# Patient Record
Sex: Male | Born: 1985 | Hispanic: No | Marital: Single | Smoking: Former smoker
Health system: Southern US, Community
[De-identification: ages and names within clinical notes are randomized; demographics above are authoritative.]

## PROBLEM LIST (undated history)

## (undated) DIAGNOSIS — F32A Depression, unspecified: Secondary | ICD-10-CM

## (undated) DIAGNOSIS — Z91018 Allergy to other foods: Secondary | ICD-10-CM

## (undated) DIAGNOSIS — F329 Major depressive disorder, single episode, unspecified: Secondary | ICD-10-CM

## (undated) DIAGNOSIS — A419 Sepsis, unspecified organism: Secondary | ICD-10-CM

## (undated) DIAGNOSIS — K760 Fatty (change of) liver, not elsewhere classified: Secondary | ICD-10-CM

## (undated) DIAGNOSIS — F419 Anxiety disorder, unspecified: Secondary | ICD-10-CM

## (undated) HISTORY — DX: Major depressive disorder, single episode, unspecified: F32.9

## (undated) HISTORY — DX: Depression, unspecified: F32.A

## (undated) HISTORY — DX: Fatty (change of) liver, not elsewhere classified: K76.0

## (undated) HISTORY — DX: Sepsis, unspecified organism: A41.9

---

## 2014-08-22 ENCOUNTER — Emergency Department (HOSPITAL_COMMUNITY)
Admission: EM | Admit: 2014-08-22 | Discharge: 2014-08-22 | Disposition: A | Discharge status: Home / Self Care | Payer: Self-pay | Attending: Emergency Medicine | Admitting: Emergency Medicine

## 2014-08-22 ENCOUNTER — Encounter (HOSPITAL_COMMUNITY): Payer: Self-pay | Admitting: Family Medicine

## 2014-08-22 ENCOUNTER — Emergency Department (HOSPITAL_COMMUNITY): Payer: Self-pay

## 2014-08-22 DIAGNOSIS — R05 Cough: Secondary | ICD-10-CM

## 2014-08-22 DIAGNOSIS — R059 Cough, unspecified: Secondary | ICD-10-CM

## 2014-08-22 DIAGNOSIS — Z720 Tobacco use: Secondary | ICD-10-CM | POA: Insufficient documentation

## 2014-08-22 DIAGNOSIS — J209 Acute bronchitis, unspecified: Secondary | ICD-10-CM | POA: Insufficient documentation

## 2014-08-22 MED ORDER — AEROCHAMBER PLUS W/MASK MISC
1.0000 | Freq: Once | Status: AC
  Administered 2014-08-22: 1
  Filled 2014-08-22: qty 1

## 2014-08-22 MED ORDER — ALBUTEROL SULFATE HFA 108 (90 BASE) MCG/ACT IN AERS
2.0000 | INHALATION_SPRAY | RESPIRATORY_TRACT | Status: DC | PRN
  Administered 2014-08-22: 2 via RESPIRATORY_TRACT
  Filled 2014-08-22 (×2): qty 6.7

## 2014-08-22 NOTE — Discharge Instructions (Signed)
Acute Bronchitis Use your inhaler 2 puffs every 4 hours as needed for cough or shortness of breath. Take Tylenol as needed for discomfort. Called out [facility] tomorrow to get a primary care physician. Ask your new primary care physician to help you to stop smoking Bronchitis is inflammation of the airways that extend from the windpipe into the lungs (bronchi). The inflammation often causes mucus to develop. This leads to a cough, which is the most common symptom of bronchitis.  In acute bronchitis, the condition usually develops suddenly and goes away over time, usually in a couple weeks. Smoking, allergies, and asthma can make bronchitis worse. Repeated episodes of bronchitis may cause further lung problems.  CAUSES Acute bronchitis is most often caused by the same virus that causes a cold. The virus can spread from person to person (contagious) through coughing, sneezing, and touching contaminated objects. SIGNS AND SYMPTOMS   Cough.   Fever.   Coughing up mucus.   Body aches.   Chest congestion.   Chills.   Shortness of breath.   Sore throat.  DIAGNOSIS  Acute bronchitis is usually diagnosed through a physical exam. Your health care provider will also ask you questions about your medical history. Tests, such as chest X-rays, are sometimes done to rule out other conditions.  TREATMENT  Acute bronchitis usually goes away in a couple weeks. Oftentimes, no medical treatment is necessary. Medicines are sometimes given for relief of fever or cough. Antibiotic medicines are usually not needed but may be prescribed in certain situations. In some cases, an inhaler may be recommended to help reduce shortness of breath and control the cough. A cool mist vaporizer may also be used to help thin bronchial secretions and make it easier to clear the chest.  HOME CARE INSTRUCTIONS  Get plenty of rest.   Drink enough fluids to keep your urine clear or pale yellow  (unless you have a medical condition that requires fluid restriction). Increasing fluids may help thin your respiratory secretions (sputum) and reduce chest congestion, and it will prevent dehydration.   Take medicines only as directed by your health care provider.  If you were prescribed an antibiotic medicine, finish it all even if you start to feel better.  Avoid smoking and secondhand smoke. Exposure to cigarette smoke or irritating chemicals will make bronchitis worse. If you are a smoker, consider using nicotine gum or skin patches to help control withdrawal symptoms. Quitting smoking will help your lungs heal faster.   Reduce the chances of another bout of acute bronchitis by washing your hands frequently, avoiding people with cold symptoms, and trying not to touch your hands to your mouth, nose, or eyes.   Keep all follow-up visits as directed by your health care provider.  SEEK MEDICAL CARE IF: Your symptoms do not improve after 1 week of treatment.  SEEK IMMEDIATE MEDICAL CARE IF:  You develop an increased fever or chills.   You have chest pain.   You have severe shortness of breath.  You have bloody sputum.   You develop dehydration.  You faint or repeatedly feel like you are going to pass out.  You develop repeated vomiting.  You develop a severe headache. MAKE SURE YOU:   Understand these instructions.  Will watch your condition.  Will get help right away if you are not doing well or get worse. Document Released: 06/18/2004 Document Revised: 09/25/2013 Document Reviewed: 11/01/2012 [facility] Patient Information 2015 [place], [place]. This information is not intended  to replace advice given to you by your health care provider. Make sure you discuss any questions you have with your health care provider.

## 2014-08-22 NOTE — ED Notes (Signed)
Pt having right sided chest pain x 2 days when he takes a deep breath. sts slight cough. sts he has been spitting stuff up.

## 2014-08-22 NOTE — ED Provider Notes (Signed)
CSN: [id number]     Arrival date & time 08/22/14  1850 History   First MD Initiated Contact with Patient 08/22/14 1932     Chief Complaint  Patient presents with  . Chest Pain     (Consider location/radiation/quality/duration/timing/severity/associated sxs/prior Treatment) HPI Complains of right-sided anterior chest pain nonradiating for 2 days. Pain is worse with coughing. Other associated symptoms; He feels congested in his chest He admits to nonproductive cough. He denies shortness of breath. No other associated symptoms. No treatment prior to coming here. History reviewed. No pertinent past medical history. past medical history negative History reviewed. No pertinent past surgical history. surgical history negative History reviewed. No pertinent family history. History  Substance Use Topics  . Smoking status: Current Every Day Smoker -- 0.50 packs/day  . Smokeless tobacco: Not on file  . Alcohol Use: No    no illicit drug use  Review of Systems  Respiratory: Positive for cough.   Cardiovascular: Positive for chest pain.  All other systems reviewed and are negative.     Allergies  Sulfa antibiotics  Home Medications   Prior to Admission medications   Not on File   BP 115/86 mmHg  Pulse 72  Temp(Src) 98.2 F (36.8 C)  Resp 16  Ht 5' 10"  (1.778 m)  Wt 260 lb (117.935 kg)  BMI 37.31 kg/m2  SpO2 99% Physical Exam  Constitutional: He appears well-developed and well-nourished.  HENT:  Head: Normocephalic and atraumatic.  Eyes: Conjunctivae are normal. Pupils are equal, round, and reactive to light.  Neck: Neck supple. No tracheal deviation present. No thyromegaly present.  Cardiovascular: Normal rate and regular rhythm.   No murmur heard. Pulmonary/Chest: Effort normal and breath sounds normal.  Abdominal: Soft. Bowel sounds are normal. He exhibits no distension. There is no tenderness.  Obese  Musculoskeletal: Normal range of motion. He exhibits no edema or  tenderness.  Neurological: He is alert. Coordination normal.  Skin: Skin is warm and dry. No rash noted.  Psychiatric: He has a normal mood and affect.  Nursing note and vitals reviewed.   ED Course  Procedures (including critical care time) Labs Review Labs Reviewed - No data to display  Imaging Review Dg Chest 2 View  08/22/2014   CLINICAL DATA:  Cough. Right-sided chest pain for 2 days with deep breath.  EXAM: CHEST  2 VIEW  COMPARISON:  None.  FINDINGS: Mild bronchial thickening and borderline prominent interstitial markings. The cardiomediastinal contours are normal. The lungs are clear. Pulmonary vasculature is normal. No consolidation, pleural effusion, or pneumothorax. No acute osseous abnormalities are seen.  IMPRESSION: Mild bronchial thickening and borderline prominent interstitial markings, can be seen in the setting of bronchitis or asthma. No focal pulmonary process.   Electronically Signed   By: Jeb Levering M.D.   On: 08/22/2014 19:23     EKG Interpretation None      chest x-ray viewed by me  feels improved after treatment with albuterol HFA with spacer  No results found for this or any previous visit. Dg Chest 2 View  08/22/2014   CLINICAL DATA:  Cough. Right-sided chest pain for 2 days with deep breath.  EXAM: CHEST  2 VIEW  COMPARISON:  None.  FINDINGS: Mild bronchial thickening and borderline prominent interstitial markings. The cardiomediastinal contours are normal. The lungs are clear. Pulmonary vasculature is normal. No consolidation, pleural effusion, or pneumothorax. No acute osseous abnormalities are seen.  IMPRESSION: Mild bronchial thickening and borderline prominent interstitial markings, can be seen  in the setting of bronchitis or asthma. No focal pulmonary process.   Electronically Signed   By: Jeb Levering M.D.   On: 08/22/2014 19:23    MDM   counseled patient for 5 minutes on smoking cessation  An albuterol HFA with spacer to go to use 2 puffs  every 4 hours when necessary cough or shortness of breath  Referral [facility]  Dx #1 acute bronchitis  #2 tobacco abuse  Final diagnoses:  Cough        Orlie Dakin, MD 08/22/14 2158

## 2016-03-25 DIAGNOSIS — A419 Sepsis, unspecified organism: Secondary | ICD-10-CM

## 2016-03-25 HISTORY — DX: Sepsis, unspecified organism: A41.9

## 2016-04-15 ENCOUNTER — Emergency Department (HOSPITAL_COMMUNITY): Payer: Self-pay

## 2016-04-15 ENCOUNTER — Encounter (HOSPITAL_COMMUNITY): Payer: Self-pay | Admitting: Nurse Practitioner

## 2016-04-15 ENCOUNTER — Inpatient Hospital Stay (HOSPITAL_COMMUNITY)
Admission: EM | Admit: 2016-04-15 | Discharge: 2016-04-17 | DRG: 871 | Disposition: A | Discharge status: Home / Self Care | Payer: Self-pay | Attending: Internal Medicine | Admitting: Internal Medicine

## 2016-04-15 DIAGNOSIS — J189 Pneumonia, unspecified organism: Secondary | ICD-10-CM | POA: Diagnosis present

## 2016-04-15 DIAGNOSIS — Z77120 Contact with and (suspected) exposure to mold (toxic): Secondary | ICD-10-CM

## 2016-04-15 DIAGNOSIS — E872 Acidosis, unspecified: Secondary | ICD-10-CM

## 2016-04-15 DIAGNOSIS — J45909 Unspecified asthma, uncomplicated: Secondary | ICD-10-CM | POA: Diagnosis present

## 2016-04-15 DIAGNOSIS — E860 Dehydration: Secondary | ICD-10-CM | POA: Diagnosis present

## 2016-04-15 DIAGNOSIS — B351 Tinea unguium: Secondary | ICD-10-CM | POA: Diagnosis present

## 2016-04-15 DIAGNOSIS — E876 Hypokalemia: Secondary | ICD-10-CM | POA: Diagnosis present

## 2016-04-15 DIAGNOSIS — Z87891 Personal history of nicotine dependence: Secondary | ICD-10-CM

## 2016-04-15 DIAGNOSIS — L818 Other specified disorders of pigmentation: Secondary | ICD-10-CM | POA: Diagnosis present

## 2016-04-15 DIAGNOSIS — R062 Wheezing: Secondary | ICD-10-CM

## 2016-04-15 DIAGNOSIS — A419 Sepsis, unspecified organism: Principal | ICD-10-CM | POA: Diagnosis present

## 2016-04-15 DIAGNOSIS — F17210 Nicotine dependence, cigarettes, uncomplicated: Secondary | ICD-10-CM | POA: Diagnosis present

## 2016-04-15 DIAGNOSIS — J209 Acute bronchitis, unspecified: Secondary | ICD-10-CM | POA: Diagnosis present

## 2016-04-15 LAB — I-STAT TROPONIN, ED: Troponin i, poc: 0 ng/mL (ref 0.00–0.08)

## 2016-04-15 LAB — DIFFERENTIAL
BASOS ABS: 0 10*3/uL (ref 0.0–0.1)
Basophils Relative: 0 %
Eosinophils Absolute: 0.4 10*3/uL (ref 0.0–0.7)
Eosinophils Relative: 3 %
LYMPHS ABS: 6.3 10*3/uL — AB (ref 0.7–4.0)
Lymphocytes Relative: 44 %
MONOS PCT: 8 %
Monocytes Absolute: 1.1 10*3/uL — ABNORMAL HIGH (ref 0.1–1.0)
NEUTROS PCT: 45 %
Neutro Abs: 6.5 10*3/uL (ref 1.7–7.7)

## 2016-04-15 LAB — URINALYSIS, ROUTINE W REFLEX MICROSCOPIC
Bilirubin Urine: NEGATIVE
GLUCOSE, UA: NEGATIVE mg/dL
Hgb urine dipstick: NEGATIVE
Ketones, ur: NEGATIVE mg/dL
LEUKOCYTES UA: NEGATIVE
NITRITE: NEGATIVE
Protein, ur: NEGATIVE mg/dL
Specific Gravity, Urine: 1.011 (ref 1.005–1.030)
pH: 6 (ref 5.0–8.0)

## 2016-04-15 LAB — CBC
HEMATOCRIT: 47.6 % (ref 39.0–52.0)
Hemoglobin: 17 g/dL (ref 13.0–17.0)
MCH: 30.2 pg (ref 26.0–34.0)
MCHC: 35.7 g/dL (ref 30.0–36.0)
MCV: 84.7 fL (ref 78.0–100.0)
PLATELETS: 340 10*3/uL (ref 150–400)
RBC: 5.62 MIL/uL (ref 4.22–5.81)
RDW: 12.5 % (ref 11.5–15.5)
WBC: 14.3 10*3/uL — ABNORMAL HIGH (ref 4.0–10.5)

## 2016-04-15 LAB — BASIC METABOLIC PANEL
Anion gap: 14 (ref 5–15)
BUN: 16 mg/dL (ref 6–20)
CALCIUM: 9.3 mg/dL (ref 8.9–10.3)
CO2: 19 mmol/L — ABNORMAL LOW (ref 22–32)
CREATININE: 1.14 mg/dL (ref 0.61–1.24)
Chloride: 102 mmol/L (ref 101–111)
GFR calc Af Amer: >60 mL/min (ref >60)
GLUCOSE: 123 mg/dL — AB (ref 65–99)
POTASSIUM: 3 mmol/L — AB (ref 3.5–5.1)
SODIUM: 135 mmol/L (ref 135–145)

## 2016-04-15 LAB — INFLUENZA PANEL BY PCR (TYPE A & B)
INFLAPCR: NEGATIVE
Influenza B By PCR: NEGATIVE

## 2016-04-15 LAB — HEPATIC FUNCTION PANEL
ALT: 36 U/L (ref 17–63)
AST: 34 U/L (ref 15–41)
Albumin: 4.9 g/dL (ref 3.5–5.0)
Alkaline Phosphatase: 62 U/L (ref 38–126)
BILIRUBIN TOTAL: 0.8 mg/dL (ref 0.3–1.2)
Total Protein: 8.1 g/dL (ref 6.5–8.1)

## 2016-04-15 LAB — I-STAT CG4 LACTIC ACID, ED
LACTIC ACID, VENOUS: 1.37 mmol/L (ref 0.5–1.9)
Lactic Acid, Venous: 5.63 mmol/L (ref 0.5–1.9)

## 2016-04-15 LAB — RAPID URINE DRUG SCREEN, HOSP PERFORMED
Amphetamines: NOT DETECTED
BARBITURATES: NOT DETECTED
Benzodiazepines: NOT DETECTED
Cocaine: NOT DETECTED
Opiates: NOT DETECTED
Tetrahydrocannabinol: POSITIVE — AB

## 2016-04-15 MED ORDER — PIPERACILLIN-TAZOBACTAM 3.375 G IVPB 30 MIN
3.3750 g | Freq: Once | INTRAVENOUS | Status: AC
  Administered 2016-04-15: 3.375 g via INTRAVENOUS
  Filled 2016-04-15: qty 50

## 2016-04-15 MED ORDER — VANCOMYCIN HCL IN DEXTROSE 1-5 GM/200ML-% IV SOLN
1000.0000 mg | Freq: Once | INTRAVENOUS | Status: AC
  Administered 2016-04-15: 1000 mg via INTRAVENOUS
  Filled 2016-04-15: qty 200

## 2016-04-15 MED ORDER — SODIUM CHLORIDE 0.9% FLUSH: 3.0000 mL | Freq: Two times a day (BID) | INTRAVENOUS | Status: DC

## 2016-04-15 MED ORDER — ONDANSETRON HCL 4 MG PO TABS: 4.0000 mg | ORAL_TABLET | Freq: Four times a day (QID) | ORAL | Status: DC | PRN

## 2016-04-15 MED ORDER — PIPERACILLIN-TAZOBACTAM 3.375 G IVPB
3.3750 g | Freq: Three times a day (TID) | INTRAVENOUS | Status: DC
  Administered 2016-04-16 (×2): 3.375 g via INTRAVENOUS
  Filled 2016-04-15 (×2): qty 50

## 2016-04-15 MED ORDER — SODIUM CHLORIDE 0.9 % IV BOLUS (SEPSIS)
1000.0000 mL | Freq: Once | INTRAVENOUS | Status: AC
  Administered 2016-04-15: 1000 mL via INTRAVENOUS

## 2016-04-15 MED ORDER — ENOXAPARIN SODIUM 40 MG/0.4ML ~~LOC~~ SOLN
40.0000 mg | Freq: Every day | SUBCUTANEOUS | Status: DC
  Administered 2016-04-16: 40 mg via SUBCUTANEOUS
  Filled 2016-04-15: qty 0.4

## 2016-04-15 MED ORDER — ACETAMINOPHEN 650 MG RE SUPP: 650.0000 mg | Freq: Four times a day (QID) | RECTAL | Status: DC | PRN

## 2016-04-15 MED ORDER — IOPAMIDOL (ISOVUE-370) INJECTION 76%
100.0000 mL | Freq: Once | INTRAVENOUS | Status: AC | PRN
  Administered 2016-04-15: 100 mL via INTRAVENOUS

## 2016-04-15 MED ORDER — SODIUM CHLORIDE 0.9 % IV BOLUS (SEPSIS)
1000.0000 mL | Freq: Once | INTRAVENOUS | Status: AC
Start: 2016-04-15 — End: 2016-04-15
  Administered 2016-04-15: 1000 mL via INTRAVENOUS

## 2016-04-15 MED ORDER — ONDANSETRON HCL 4 MG/2ML IJ SOLN: 4.0000 mg | Freq: Four times a day (QID) | INTRAMUSCULAR | Status: DC | PRN

## 2016-04-15 MED ORDER — LORAZEPAM 2 MG/ML IJ SOLN
0.5000 mg | Freq: Once | INTRAMUSCULAR | Status: AC
  Administered 2016-04-15: 0.5 mg via INTRAVENOUS
  Filled 2016-04-15: qty 1

## 2016-04-15 MED ORDER — ALBUTEROL SULFATE (2.5 MG/3ML) 0.083% IN NEBU: 2.5000 mg | INHALATION_SOLUTION | RESPIRATORY_TRACT | Status: DC | PRN

## 2016-04-15 MED ORDER — POTASSIUM CHLORIDE CRYS ER 20 MEQ PO TBCR
40.0000 meq | EXTENDED_RELEASE_TABLET | Freq: Once | ORAL | Status: AC
  Administered 2016-04-15: 40 meq via ORAL
  Filled 2016-04-15: qty 2

## 2016-04-15 MED ORDER — SODIUM CHLORIDE 0.9 % IJ SOLN
INTRAMUSCULAR | Status: AC
  Filled 2016-04-15: qty 50

## 2016-04-15 MED ORDER — VANCOMYCIN HCL IN DEXTROSE 1-5 GM/200ML-% IV SOLN
1000.0000 mg | Freq: Three times a day (TID) | INTRAVENOUS | Status: DC
  Administered 2016-04-16 (×2): 1000 mg via INTRAVENOUS
  Filled 2016-04-15 (×2): qty 200

## 2016-04-15 MED ORDER — POTASSIUM CHLORIDE IN NACL 20-0.9 MEQ/L-% IV SOLN
INTRAVENOUS | Status: DC
  Administered 2016-04-16: via INTRAVENOUS
  Filled 2016-04-15 (×2): qty 1000

## 2016-04-15 MED ORDER — ACETAMINOPHEN 325 MG PO TABS: 650.0000 mg | ORAL_TABLET | Freq: Four times a day (QID) | ORAL | Status: DC | PRN

## 2016-04-15 NOTE — Progress Notes (Signed)
EDCM spoke to patient at bedside. Patient confirms he does not have a pcp or insurance living in [place].  [facility] provided patient with contact information to [facility], informed patient of services there.  EDCM also provided patient with list of pcps who accept self pay patients, list of discount pharmacies and websites needymeds.org and GoodRX.com for medication assistance, phone number to inquire about the orange card, phone number to inquire about Medicaid, phone number to inquire about the [place], financial resources in the community such as local churches, salvation [employer], urban ministries, and dental assistance for uninsured patients.  Patient thankful for resources.  No further EDCM needs at this time.

## 2016-04-15 NOTE — H&P (Signed)
History and Physical  Patient Name: Jared Hunt     CWC:[id number]    DOB: 24-May-1986    DOA: 04/15/2016 PCP: No primary care provider on file.   Patient coming from: Home  Chief Complaint: Shortness of breath, dizziness  HPI: Jared Hunt is a 30 y.o. male with no significant past medical history who presents with several months progressive intermittent shortness of breath and cough.  The patient relates that he was in his usual state of health until a few months ago when he moved into a new apartment.  Since moving in, he started to have mild but progressive shortness of breath and cough and sinus congestion.  Then about two months ago he started staying mostly at his girlfriend's house, and noticed when he went back that (1) his symptoms of shortness of breath, chest and sinus congestion and cough always returned when he went to his apartment, and (2) things in his apartment like the carpet, walls, pillows, couch, and bed appeared to be getting covered with black mold.  He tried eating healthy and exercising, but that didn't help, and his symptoms became more and more obvious until today he was at work cutting strawberries when he felt weak, dizzy, and so short of breath he was going to pass out, so he came to the ER.  ED course: -Temp 99.75F, heart rate 156, respirations 20s, blood pressure 103/55, pulse oximetry normal -Na 135, K 3.0, Cr 1.14, WBC 14.3K, Hgb 17 -Troponin negative -Lactic acid 5.6 -Urinalysis clear -UDS showed only THC -Chest x-ray with mild interstitial thickening -CT angiogram of the chest showed no PE, only bronchial thickening consistent with bronchitis -ECG showed sinus tachycardia -He was given vancomycin and Zosyn and 30 cc/kg of fluids as well as potassium and TRH were asked to evaluate for presumed sepsis, unknown source -Repeat lactic acid completely resolved with fluids and the patient appeared comfortable in the ER and mentating well    The  patient denies IV drug use, has snorted drugs a few times. He denies having sex with men. He was last tested for HIV a few months ago and was negative.  He denies recent fever, chills, purulent sputum. He denies dysuria, hematuria, urinary frequency, urinary urgency. He denies skin sores, abdominal pain, chest pain.  He has had some episodes of bronchitis in the past, but has never been diagnosed with asthma, has no family history of asthma or allergic disease. He is not using an inhaler, has no ongoing primary care. He has no real allergic disease history personally.        ROS: Review of Systems  Constitutional: Negative for chills, fever, malaise/fatigue and weight loss.  Respiratory: Positive for shortness of breath.           History reviewed. No pertinent past medical history.  History reviewed. No pertinent surgical history.  Social History: Patient lives with his girlfriend.  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  The patient walks unasssited.  He smokes.    Allergies  Allergen Reactions  . Sulfa Antibiotics Anaphylaxis    Family history: family history includes Osteoarthritis in his mother.  Prior to Admission medications   Medication Sig Start Date End Date Taking? Authorizing Provider  Chlorella POWD Take 15 mg by mouth daily. 1 Tablespoonful.   Yes Historical Provider, MD  Creatine Monohydrate POWD Take by mouth See admin instructions. Take 1 gram daily.   Yes Historical Provider, MD  Garlic 7616 MG  CAPS Take 2,000 mg by mouth daily.   Yes Historical Provider, MD  guaiFENesin (MUCINEX) 600 MG 12 hr tablet Take 600 mg by mouth 2 (two) times daily as needed for cough or to loosen phlegm.   Yes Historical Provider, MD  L-Methionine POWD Take by mouth daily. As directed.   Yes Historical Provider, MD  MAGNESIUM PO Take 1 tablet by mouth daily as needed (magnesium supplementation.).   Yes Historical Provider, MD  Misc Natural Products (TURMERIC CURCUMIN)  CAPS Take 1 capsule by mouth daily. 1000 mg.   Yes Historical Provider, MD  MISC NATURAL PRODUCTS PO Take 1 capsule by mouth daily. "SerraGold."  Contains per product label: 18 mg calcium citrate , magnesium citrate 0.1 mg, 80,000 HUT protease, and 100,000 SPU serrapeptase.   Yes Historical Provider, MD  UNABLE TO FIND as directed. Med Name: "Asthma Away/"   Yes Historical Provider, MD       Physical Exam: BP (!) 127/51 (BP Location: Left Arm)   Pulse 105   Temp 99.4 F (37.4 C) (Oral)   Resp 14   Ht 5' 10"  (1.778 m)   Wt 108.9 kg (240 lb)   SpO2 100%   BMI 34.44 kg/m  General appearance: Well-developed, adult male, alert and in no acute distress, slightly anxious.   Eyes: Anicteric, conjunctiva pink, lids and lashes normal. PERRL.    ENT: No nasal deformity, discharge, epistaxis.  Hearing normal. OP moist without lesions.   Neck: No neck masses.  Trachea midline.  No thyromegaly/tenderness. Lymph: No cervical or supraclavicular lymphadenopathy. Skin: Warm and dry.  No jaundice.  No suspicious rashes or lesions. Cardiac: RRR, nl S1-S2, no murmurs appreciated.  Capillary refill is brisk.  JVP normal.  No LE edema.  Radial and DP pulses 2+ and symmetric. Respiratory: Normal respiratory rate and rhythm.  CTAB without rales.  Few scattered inspiratory wheezes. Abdomen: Abdomen soft.  No TTP. No ascites, distension, hepatosplenomegaly.   MSK: No deformities or effusions.  No cyanosis or clubbing. Neuro: Cranial nerves normal.  Sensation intact to light touch. Speech is fluent.  Muscle strength normal.    Psych: Sensorium intact and responding to questions, attention normal.  Behavior appropriate.  Affect normal.  Judgment and insight appear normal.     Labs on Admission:  I have personally reviewed following labs and imaging studies: CBC:  Recent Labs Lab 04/15/16 1625 04/16/16 0336  WBC 14.3* 12.4*  NEUTROABS 6.5  --   HGB 17.0 15.2  HCT 47.6 42.3  MCV 84.7 84.3  PLT 340  546   Basic Metabolic Panel:  Recent Labs Lab 04/15/16 1625 04/16/16 0336  NA 135 138  K 3.0* 4.2  CL 102 110  CO2 19* 20*  GLUCOSE 123* 88  BUN 16 12  CREATININE 1.14 0.96  CALCIUM 9.3 8.6*   GFR: Estimated Creatinine Clearance: 139.1 mL/min (by C-G formula based on SCr of 0.96 mg/dL).  Liver Function Tests:  Recent Labs Lab 04/15/16 1625  AST 34  ALT 36  ALKPHOS 62  BILITOT 0.8  PROT 8.1  ALBUMIN 4.9   No results for input(s): LIPASE, AMYLASE in the last 168 hours. No results for input(s): AMMONIA in the last 168 hours. Coagulation Profile: No results for input(s): INR, PROTIME in the last 168 hours. Cardiac Enzymes: No results for input(s): CKTOTAL, CKMB, CKMBINDEX, TROPONINI in the last 168 hours. BNP (last 3 results) No results for input(s): PROBNP in the last 8760 hours. HbA1C: No results for input(s): HGBA1C  in the last 72 hours. CBG: No results for input(s): GLUCAP in the last 168 hours. Lipid Profile: No results for input(s): CHOL, HDL, LDLCALC, TRIG, CHOLHDL, LDLDIRECT in the last 72 hours. Thyroid Function Tests: No results for input(s): TSH, T4TOTAL, FREET4, T3FREE, THYROIDAB in the last 72 hours. Anemia Panel: No results for input(s): VITAMINB12, FOLATE, FERRITIN, TIBC, IRON, RETICCTPCT in the last 72 hours. Sepsis Labs: Lactic acid 5.6 --> 1.3 Invalid input(s): PROCALCITONIN, LACTICIDVEN No results found for this or any previous visit (from the past 240 hour(s)).       Radiological Exams on Admission: Personally reviewed CXR shows no focal opacity.  CTA report reviewed: Dg Chest 2 View  Result Date: 04/15/2016 CLINICAL DATA:  30 year old male with a history of shortness of breath and tachypnea EXAM: CHEST  2 VIEW COMPARISON:  08/22/2014 FINDINGS: Cardiomediastinal silhouette unchanged in size and contour. No evidence of central vascular congestion. No pneumothorax or pleural effusion. No displaced fracture. IMPRESSION: No radiographic  evidence of acute cardiopulmonary disease. Signed, Dulcy Fanny. Earleen Newport, DO [facility] Radiology Electronically Signed   By: Corrie Mckusick D.O.   On: 04/15/2016 16:55   Ct Angio Chest Pe W And/or Wo Contrast  Result Date: 04/15/2016 CLINICAL DATA:  Shortness of breath and tachycardia. EXAM: CT ANGIOGRAPHY CHEST WITH CONTRAST TECHNIQUE: Multidetector CT imaging of the chest was performed using the standard protocol during bolus administration of intravenous contrast. Multiplanar CT image reconstructions and MIPs were obtained to evaluate the vascular anatomy. CONTRAST:  100 cc of Isovue 370 COMPARISON:  Chest radiograph April 15, 2016 at 1648 hours FINDINGS: CARDIOVASCULAR: Suboptimal contrast opacification of the pulmonary artery's (165 Hounsfield units, target is 250 Hounsfield units. Main pulmonary artery is not enlarged. No pulmonary arterial filling defects to the level of the proximal segmental branches. Heart size is normal, no right heart strain. No pericardial effusions. Thoracic aorta is normal course and caliber, unremarkable. MEDIASTINUM/NODES: No lymphadenopathy by CT size criteria. LUNGS/PLEURA: Mild bronchial wall thickening with mucoid impaction in the lower lobes. No pneumothorax. Small pleural effusion or focal consolidation. 3 mm RIGHT lower lobe pulmonary nodule, no indicated follow-up due to patient's young age. UPPER ABDOMEN: Included view of the abdomen is unremarkable. MUSCULOSKELETAL: Visualized soft tissues and included osseous structures appear normal. Review of the MIP images confirms the above findings. IMPRESSION: No acute pulmonary embolism on this technically limited examination. Bronchial wall thickening with lower lobe bronchi mucoid impaction associated can be seen with bronchitis or reactive airway disease. Electronically Signed   By: Elon Alas M.D.   On: 04/15/2016 18:48    EKG: Independently reviewed. Rate 148, QTc  normal, sinus tachycardia.    Assessment/Plan Principal Problem:   Sepsis ([place]) Active Problems:   Hypokalemia  1. Possible sepsis:  Suspected source unclear, atypical for pneumonia sepsis. Organism unknown. Patient meets criteria given tachycardia, tachypnea, leukocytosis, and evidence of organ dysfunction.  Alternative explanations for lactic acidosis include alcohol use and dehydration and asthma/non-infectious respiratory disease.  He has no evidence of liver failure.  -Sepsis bundle utilized:  -Blood and urine cultures drawn  -30 ml/kg bolus given in ED, will repeat lactic acid  -Start targeted antibiotics with vancomycin, Zosyn and azithromycin, to cover undifferentiated bacteremia (given no focal pneumonia on CT) as well as atypical pneumonia  -Repeat renal function and complete blood count in AM  -Code SEPSIS called to E-link       2. Dyspnea:  Other differential for the patient's dyspnea include asthma,  hypersensitivity pneumonitis.  Fungal pneumonia seems unlikely in immunocompetent patient. -Check CBC with diff/eosinophils and procalcitonin -Check IgE level -Albuterol PRN -Start steroids after diff returns -Check HIV  3. Hypokalemia:  -Check magnesium -Supplemented K in ER -Repeat BMP         DVT prophylaxis: Lovenox  Code Status: FULL  Family Communication: Mother and girlfriend at bedside  Disposition Plan: Anticipate continue empiric antibiotics and follow culture data.  Check procalcitonin, IgE and diff.  Check HIV.  De-escalate antibiotics as able. Consults called: None Admission status: Observation, med surg At the point of initial evaluation, it is my clinical opinion that admission for OBSERVATION is reasonable and necessary because the patient's presenting complaints in the context of their chronic conditions represent sufficient risk of deterioration or significant morbidity to constitute reasonable grounds for close observation in the hospital  setting, but that the patient may be medically stable for discharge from the hospital within 24 to 48 hours.    Medical decision making: Patient seen at 9:00 PM on 04/15/2016.  What exists of the patient's chart was reviewed in depth and summarized above.  Clinical condition: stable.        Edwin Dada [facility] Pager [phone]

## 2016-04-15 NOTE — ED Provider Notes (Addendum)
[facility] DEPT Provider Note   CSN: [id number] Arrival date & time: 04/15/16  1615     History   Chief Complaint Chief Complaint  Patient presents with  . Shortness of Breath  . Tachycardia    HPI Jared Hunt is a 30 y.o. male.  HPI Patient presents with 2 weeks of cough productive of yellow and brown sputum. He's had increasing shortness of breath and subjective fevers and chills. States he was at work today and became lightheaded feelings you pass out. This prompted his visit to the emergency department. States he's been taking multiple remedies without relief of his symptoms. Is concerned there is mold in his apartment causing his symptoms. Denies any chest pain, lower extremity swelling. No prolonged travel or immobility. No history of PE/DVT. History reviewed. No pertinent past medical history.  Patient Active Problem List   Diagnosis Date Noted  . Sepsis ([place]) 04/15/2016    History reviewed. No pertinent surgical history.     Home Medications    Prior to Admission medications   Medication Sig Start Date End Date Taking? Authorizing Provider  Chlorella POWD Take 15 mg by mouth daily. 1 Tablespoonful.   Yes Historical Provider, MD  Creatine Monohydrate POWD Take by mouth See admin instructions. Take 1 gram daily.   Yes Historical Provider, MD  Garlic 6754 MG CAPS Take 2,000 mg by mouth daily.   Yes Historical Provider, MD  guaiFENesin (MUCINEX) 600 MG 12 hr tablet Take 600 mg by mouth 2 (two) times daily as needed for cough or to loosen phlegm.   Yes Historical Provider, MD  L-Methionine POWD Take by mouth daily. As directed.   Yes Historical Provider, MD  MAGNESIUM PO Take 1 tablet by mouth daily as needed (magnesium supplementation.).   Yes Historical Provider, MD  Misc Natural Products (TURMERIC CURCUMIN) CAPS Take 1 capsule by mouth daily. 1000 mg.   Yes Historical Provider, MD  MISC NATURAL PRODUCTS PO Take 1 capsule by mouth daily. "SerraGold."   Contains per product label: 18 mg calcium citrate , magnesium citrate 0.1 mg, 80,000 HUT protease, and 100,000 SPU serrapeptase.   Yes Historical Provider, MD  UNABLE TO FIND as directed. Med Name: "Asthma Away/"   Yes Historical Provider, MD    Family History History reviewed. No pertinent family history.  Social History Social History  Substance Use Topics  . Smoking status: Current Every Day Smoker    Packs/day: 0.50  . Smokeless tobacco: Never Used  . Alcohol use No     Allergies   Sulfa antibiotics   Review of Systems Review of Systems  Constitutional: Positive for chills, fatigue and fever.  HENT: Positive for congestion, sinus pressure and sore throat.   Eyes: Negative for photophobia and visual disturbance.  Respiratory: Positive for cough and shortness of breath. Negative for chest tightness.   Cardiovascular: Positive for palpitations. Negative for chest pain and leg swelling.  Gastrointestinal: Negative for abdominal pain, diarrhea, nausea and vomiting.  Genitourinary: Negative for dysuria, flank pain, frequency and hematuria.  Musculoskeletal: Negative for back pain, myalgias, neck pain and neck stiffness.  Skin: Negative for rash and wound.  Neurological: Positive for dizziness and light-headedness. Negative for syncope, weakness, numbness and headaches.  Psychiatric/Behavioral: The patient is nervous/anxious.   All other systems reviewed and are negative.    Physical Exam Updated Vital Signs BP 130/74 (BP Location: Right Arm)   Pulse 96   Temp 97.7 F (36.5 C)   Resp 14   Ht  5' 10"  (1.778 m)   Wt 240 lb (108.9 kg)   SpO2 100%   BMI 34.44 kg/m   Physical Exam  Constitutional: He is oriented to person, place, and time. He appears well-developed and well-nourished.  Very anxious appearing  HENT:  Head: Normocephalic and atraumatic.  Mouth/Throat: Oropharynx is clear and moist. No oropharyngeal exudate.  Bilateral nasal mucosal edema. Oropharynx is  mildly erythematous. Uvula is midline.  Eyes: EOM are normal. Pupils are equal, round, and reactive to light. Right eye exhibits discharge. Left eye exhibits no discharge.  Neck: Normal range of motion. Neck supple.  No meningismus  Cardiovascular: Regular rhythm.  Exam reveals no gallop and no friction rub.   No murmur heard. Tachycardia  Pulmonary/Chest: No stridor. He has rales.  Tachypnea. Scattered Rales  Abdominal: Soft. Bowel sounds are normal. There is no tenderness. There is no rebound and no guarding.  Musculoskeletal: Normal range of motion. He exhibits no edema or tenderness.  No lower extremity swelling, asymmetry or tenderness. Distal pulses intact.  Lymphadenopathy:    He has no cervical adenopathy.  Neurological: He is alert and oriented to person, place, and time.  Patient is alert and oriented x3 with clear, goal oriented speech. Patient has 5/5 motor in all extremities. Sensation is intact to light touch.    Skin: Skin is warm and dry. Capillary refill takes less than 2 seconds. No rash noted. No erythema.  Psychiatric: He has a normal mood and affect. His behavior is normal.  Nursing note and vitals reviewed.    ED Treatments / Results  Labs (all labs ordered are listed, but only abnormal results are displayed) Labs Reviewed  BASIC METABOLIC PANEL - Abnormal; Notable for the following:       Result Value   Potassium 3.0 (*)    CO2 19 (*)    Glucose, Bld 123 (*)    All other components within normal limits  CBC - Abnormal; Notable for the following:    WBC 14.3 (*)    All other components within normal limits  HEPATIC FUNCTION PANEL - Abnormal; Notable for the following:    Bilirubin, Direct <0.1 (*)    All other components within normal limits  RAPID URINE DRUG SCREEN, HOSP PERFORMED - Abnormal; Notable for the following:    Tetrahydrocannabinol POSITIVE (*)    All other components within normal limits  I-STAT CG4 LACTIC ACID, ED - Abnormal; Notable for  the following:    Lactic Acid, Venous 5.63 (*)    All other components within normal limits  CULTURE, BLOOD (ROUTINE X 2)  CULTURE, BLOOD (ROUTINE X 2)  URINE CULTURE  URINALYSIS, ROUTINE W REFLEX MICROSCOPIC (NOT AT [facility])  I-STAT TROPOININ, ED    EKG  EKG Interpretation  Date/Time:  Wednesday April 15 2016 16:25:20 EST Ventricular Rate:  158 PR Interval:    QRS Duration: 89 QT Interval:  273 QTC Calculation: 443 R Axis:   95 Text Interpretation:  Sinus tachycardia Borderline right axis deviation Borderline T abnormalities, inferior leads Confirmed by Lita Mains  MD, Sejla Marzano ([id number]) on 04/15/2016 4:48:02 PM       Radiology Dg Chest 2 View  Result Date: 04/15/2016 CLINICAL DATA:  30 year old male with a history of shortness of breath and tachypnea EXAM: CHEST  2 VIEW COMPARISON:  08/22/2014 FINDINGS: Cardiomediastinal silhouette unchanged in size and contour. No evidence of central vascular congestion. No pneumothorax or pleural effusion. No displaced fracture. IMPRESSION: No radiographic evidence of acute cardiopulmonary disease. Signed, York Cerise  Pasty Arch, DO [facility] Electronically Signed   By: Corrie Mckusick D.O.   On: 04/15/2016 16:55   Ct Angio Chest Pe W And/or Wo Contrast  Result Date: 04/15/2016 CLINICAL DATA:  Shortness of breath and tachycardia. EXAM: CT ANGIOGRAPHY CHEST WITH CONTRAST TECHNIQUE: Multidetector CT imaging of the chest was performed using the standard protocol during bolus administration of intravenous contrast. Multiplanar CT image reconstructions and MIPs were obtained to evaluate the vascular anatomy. CONTRAST:  100 cc of Isovue 370 COMPARISON:  Chest radiograph April 15, 2016 at 1648 hours FINDINGS: CARDIOVASCULAR: Suboptimal contrast opacification of the pulmonary artery's (165 Hounsfield units, target is 250 Hounsfield units. Main pulmonary artery is not enlarged. No pulmonary arterial  filling defects to the level of the proximal segmental branches. Heart size is normal, no right heart strain. No pericardial effusions. Thoracic aorta is normal course and caliber, unremarkable. MEDIASTINUM/NODES: No lymphadenopathy by CT size criteria. LUNGS/PLEURA: Mild bronchial wall thickening with mucoid impaction in the lower lobes. No pneumothorax. Small pleural effusion or focal consolidation. 3 mm RIGHT lower lobe pulmonary nodule, no indicated follow-up due to patient's young age. UPPER ABDOMEN: Included view of the abdomen is unremarkable. MUSCULOSKELETAL: Visualized soft tissues and included osseous structures appear normal. Review of the MIP images confirms the above findings. IMPRESSION: No acute pulmonary embolism on this technically limited examination. Bronchial wall thickening with lower lobe bronchi mucoid impaction associated can be seen with bronchitis or reactive airway disease. Electronically Signed   By: Elon Alas M.D.   On: 04/15/2016 18:48    Procedures  Procedures (including critical care time)  Medications Ordered in ED Medications  sodium chloride 0.9 % bolus 1,000 mL (0 mLs Intravenous Stopped 04/15/16 1850)    And  sodium chloride 0.9 % bolus 1,000 mL (0 mLs Intravenous Stopped 04/15/16 1825)    And  sodium chloride 0.9 % bolus 1,000 mL (1,000 mLs Intravenous New Bag/Given 04/15/16 1825)  sodium chloride 0.9 % injection (  Canceled Entry 04/15/16 1730)  sodium chloride 0.9 % bolus 1,000 mL (0 mLs Intravenous Stopped 04/15/16 1723)  LORazepam (ATIVAN) injection 0.5 mg (0.5 mg Intravenous Given 04/15/16 1644)  piperacillin-tazobactam (ZOSYN) IVPB 3.375 g (0 g Intravenous Stopped 04/15/16 1746)  vancomycin (VANCOCIN) IVPB 1000 mg/200 mL premix (1,000 mg Intravenous New Bag/Given 04/15/16 1747)  potassium chloride SA (K-DUR,KLOR-CON) CR tablet 40 mEq (40 mEq Oral Given 04/15/16 1743)  iopamidol (ISOVUE-370) 76 % injection 100 mL (100 mLs Intravenous Contrast  Given 04/15/16 1808)     Initial Impression / Assessment and Plan / ED Course  I have reviewed the triage vital signs and the nursing notes.  Pertinent labs & imaging results that were available during my care of the patient were reviewed by me and considered in my medical decision making (see chart for details).  Clinical Course   Chest x-ray without definite evidence of pneumonia. Given tachycardia and shortness of breath. Get CT angiogram rule out PE. Patient does have elevation of lactic acid. Started on sepsis protocol.   Tachycardia has resolved. Vital signs remained stable. Discussed with [facility] will admit to [facility] bed.  Final Clinical Impressions(s) / ED Diagnoses   Final diagnoses:  Sepsis, due to unspecified organism [facility])    New Prescriptions New Prescriptions   No medications on file  CRITICAL CARE Performed by: Lita Mains, Latrica Clowers Total critical care time: 30 minutes Critical care time was exclusive of separately billable procedures and treating other patients. Critical care  was necessary to treat or prevent imminent or life-threatening deterioration. Critical care was time spent personally by me on the following activities: development of treatment plan with patient and/or surrogate as well as nursing, discussions with consultants, evaluation of patient's response to treatment, examination of patient, obtaining history from patient or surrogate, ordering and performing treatments and interventions, ordering and review of laboratory studies, ordering and review of radiographic studies, pulse oximetry and re-evaluation of patient's condition.   Julianne Rice, MD 04/15/16 Yevette Edwards    Julianne Rice, MD 04/15/16 [id number]

## 2016-04-15 NOTE — ED Notes (Signed)
Paged admitting doctor regarding bed placement request.

## 2016-04-15 NOTE — ED Notes (Signed)
Patient transported to CT 

## 2016-04-15 NOTE — ED Triage Notes (Signed)
Pt presents urgently with shortness of breath, tachypnea and tachycardia, reports a sudden onset that he suspects was triggered by "mold exposure." Denies any cardiopulmonary hx.

## 2016-04-15 NOTE — Progress Notes (Signed)
Pharmacy Antibiotic Note  MASAI KIDD is a 30 y.o. male admitted on 04/15/2016 with sepsis.  Pharmacy has been consulted for vancomycin and zosyn dosing.  Plan: Vancomycin 2gm load then 1gm IV q8h Zosyn 3.375g IV Q8H infused over 4hrs. Follow renal function, cultures, clinical course  Height: 5' 10"  (177.8 cm) Weight: 240 lb (108.9 kg) IBW/kg (Calculated) : 73  Temp (24hrs), Avg:97.7 F (36.5 C), Min:97.7 F (36.5 C), Max:97.7 F (36.5 C)   Recent Labs Lab 04/15/16 1625 04/15/16 1642  WBC 14.3*  --   CREATININE 1.14  --   LATICACIDVEN  --  5.63*    Estimated Creatinine Clearance: 118.2 mL/min (by C-G formula based on SCr of 1.14 mg/dL).    Allergies  Allergen Reactions  . Sulfa Antibiotics Anaphylaxis    Antimicrobials this admission: 11/22 vanc >> 11/22 zosyn >>  Microbiology results: 11/22 BCx: sent 11/22 UCx:    Thank you for allowing pharmacy to be a part of this patient's care.  Dolly Rias RPh 04/15/2016, 6:56 PM Pager [phone]

## 2016-04-15 NOTE — ED Notes (Signed)
Patient transported to X-ray 

## 2016-04-16 DIAGNOSIS — R000 Tachycardia, unspecified: Secondary | ICD-10-CM

## 2016-04-16 DIAGNOSIS — Z87891 Personal history of nicotine dependence: Secondary | ICD-10-CM

## 2016-04-16 DIAGNOSIS — R062 Wheezing: Secondary | ICD-10-CM

## 2016-04-16 DIAGNOSIS — L818 Other specified disorders of pigmentation: Secondary | ICD-10-CM

## 2016-04-16 DIAGNOSIS — F17210 Nicotine dependence, cigarettes, uncomplicated: Secondary | ICD-10-CM

## 2016-04-16 DIAGNOSIS — Z8489 Family history of other specified conditions: Secondary | ICD-10-CM

## 2016-04-16 DIAGNOSIS — Z882 Allergy status to sulfonamides status: Secondary | ICD-10-CM

## 2016-04-16 DIAGNOSIS — Z8261 Family history of arthritis: Secondary | ICD-10-CM

## 2016-04-16 DIAGNOSIS — J209 Acute bronchitis, unspecified: Secondary | ICD-10-CM

## 2016-04-16 DIAGNOSIS — E872 Acidosis, unspecified: Secondary | ICD-10-CM

## 2016-04-16 DIAGNOSIS — B351 Tinea unguium: Secondary | ICD-10-CM

## 2016-04-16 DIAGNOSIS — R75 Inconclusive laboratory evidence of human immunodeficiency virus [HIV]: Secondary | ICD-10-CM

## 2016-04-16 DIAGNOSIS — Z825 Family history of asthma and other chronic lower respiratory diseases: Secondary | ICD-10-CM

## 2016-04-16 DIAGNOSIS — D72829 Elevated white blood cell count, unspecified: Secondary | ICD-10-CM

## 2016-04-16 LAB — CBC
HEMATOCRIT: 42.3 % (ref 39.0–52.0)
HEMOGLOBIN: 15.2 g/dL (ref 13.0–17.0)
MCH: 30.3 pg (ref 26.0–34.0)
MCHC: 35.9 g/dL (ref 30.0–36.0)
MCV: 84.3 fL (ref 78.0–100.0)
Platelets: 247 10*3/uL (ref 150–400)
RBC: 5.02 MIL/uL (ref 4.22–5.81)
RDW: 12.6 % (ref 11.5–15.5)
WBC: 12.4 10*3/uL — ABNORMAL HIGH (ref 4.0–10.5)

## 2016-04-16 LAB — BASIC METABOLIC PANEL
Anion gap: 8 (ref 5–15)
BUN: 12 mg/dL (ref 6–20)
CHLORIDE: 110 mmol/L (ref 101–111)
CO2: 20 mmol/L — AB (ref 22–32)
CREATININE: 0.96 mg/dL (ref 0.61–1.24)
Calcium: 8.6 mg/dL — ABNORMAL LOW (ref 8.9–10.3)
GFR calc non Af Amer: >60 mL/min (ref >60)
Glucose, Bld: 88 mg/dL (ref 65–99)
POTASSIUM: 4.2 mmol/L (ref 3.5–5.1)
SODIUM: 138 mmol/L (ref 135–145)

## 2016-04-16 LAB — RAPID HIV SCREEN (HIV 1/2 AB+AG)
HIV 1/2 ANTIBODIES: REACTIVE — AB
HIV-1 P24 ANTIGEN - HIV24: NONREACTIVE

## 2016-04-16 LAB — URINE CULTURE: Culture: NO GROWTH

## 2016-04-16 LAB — MAGNESIUM: MAGNESIUM: 2.4 mg/dL (ref 1.7–2.4)

## 2016-04-16 LAB — PROCALCITONIN

## 2016-04-16 MED ORDER — AZITHROMYCIN 250 MG PO TABS
500.0000 mg | ORAL_TABLET | Freq: Every day | ORAL | Status: AC
  Administered 2016-04-16: 500 mg via ORAL
  Filled 2016-04-16: qty 2

## 2016-04-16 MED ORDER — LORAZEPAM 1 MG PO TABS
1.0000 mg | ORAL_TABLET | Freq: Once | ORAL | Status: AC
  Administered 2016-04-16: 1 mg via ORAL
  Filled 2016-04-16: qty 1

## 2016-04-16 MED ORDER — AZITHROMYCIN 250 MG PO TABS
250.0000 mg | ORAL_TABLET | Freq: Every day | ORAL | Status: DC
  Administered 2016-04-17: 250 mg via ORAL
  Filled 2016-04-16: qty 1

## 2016-04-16 MED ORDER — ENOXAPARIN SODIUM 60 MG/0.6ML ~~LOC~~ SOLN
0.5000 mg/kg | Freq: Every day | SUBCUTANEOUS | Status: DC
  Administered 2016-04-16: 55 mg via SUBCUTANEOUS
  Filled 2016-04-16: qty 0.6

## 2016-04-16 MED ORDER — POTASSIUM CHLORIDE IN NACL 20-0.9 MEQ/L-% IV SOLN
INTRAVENOUS | Status: AC
  Administered 2016-04-16: 21:00:00 via INTRAVENOUS
  Filled 2016-04-16: qty 1000

## 2016-04-16 MED ORDER — ALPRAZOLAM 1 MG PO TABS
1.0000 mg | ORAL_TABLET | Freq: Two times a day (BID) | ORAL | Status: DC | PRN
  Administered 2016-04-16 – 2016-04-17 (×3): 1 mg via ORAL
  Filled 2016-04-16 (×3): qty 1

## 2016-04-16 NOTE — Progress Notes (Signed)
Patient ID: EUGEAN ARNOTT, male   DOB: 1985-07-22, 30 y.o.   MRN: [id number]  PROGRESS NOTE    Zakaria Sedor Criger  POL:[id number] DOB: 1986-01-14 DOA: 04/15/2016  PCP: No primary care provider on file.   Brief Narrative:  30 y.o. male with no significant past medical history who presented to [facility] with progressive shortness of breath and cough over past few months prior to this admission. He reported he moved into a new apartment which seemed it had lots of mold and since he lives there he started to notice his symptoms just progressively got worse. About one day prior to this admission he started to feel very weak and dizzy which prompted him to come to ED for evaluation.  In ED, pt was hemodynamically stable. Blood work showed leukocytosis of 14.3, lactic acid 5.63 which on subsequent lab it normalized. Chest x-ray showed no acute cardiopulmonary findings. CT angiogram of the chest ruled out pulmonary embolism. He was started empirically on vancomycin and Zosyn as well as azithromycin for treatment of sepsis secondary to possible bronchitis or pneumonia.   Assessment & Plan:   Principal Problem:   Sepsis secondary to bronchitis versus pneumonia (HCC) / leukocytosis - Sepsis criteria met on the admission with tachycardia, tachypnea, leukocytosis and lactic acidosis with source of infection likely bronchitis versus pneumonia - Patient started on empiric azithromycin, vancomycin and Zosyn - Blood culture and urine culture is pending - HIV result is positive on screening test and awaiting confirmation. I communicated the results with the patient in the room, please note his girlfriend was not present in the room during the time of my discussion of the results.  Active Problems:   Hypokalemia - Likely from sepsis, subsequently normalized   DVT prophylaxis: Lovenox subQ  Code Status: full code  Family Communication: girlfriend at the bedside Disposition Plan: home in  am   Consultants:   None   Procedures:   None   Antimicrobials:   Azithromycin 04/15/2016 -->  Zosyn 04/15/2016 -->  Vanco 04/15/2016 -->   Subjective: No overnight events.   Objective: Vitals:   04/15/16 2200 04/15/16 2215 04/15/16 2334 04/16/16 0415  BP: 117/70 118/67 115/74 103/73  Pulse: 99 109 (!) 108 72  Resp: 14 19 18    Temp:   98.5 F (36.9 C) 97.9 F (36.6 C)  TempSrc:   Oral Oral  SpO2: 100% 100% 100% 100%  Weight:   107 kg (235 lb 14.3 oz)   Height:   5' 10"  (1.778 m)     Intake/Output Summary (Last 24 hours) at 04/16/16 0954 Last data filed at 04/16/16 0600  Gross per 24 hour  Intake             5180 ml  Output             1800 ml  Net             3380 ml   Filed Weights   04/15/16 1713 04/15/16 2334  Weight: 108.9 kg (240 lb) 107 kg (235 lb 14.3 oz)    Examination:  General exam: Appears calm and comfortable  Respiratory system: No wheezing, no rhonchi  Cardiovascular system: S1 & S2 heard, RRR. No JVD, murmurs, rubs, gallops or clicks. No pedal edema. Gastrointestinal system: Abdomen is nondistended, soft and nontender. No organomegaly or masses felt. Normal bowel sounds heard. Central nervous system: Alert and oriented. No focal neurological deficits. Extremities: Symmetric 5 x 5 power. Skin: No rashes, lesions or  ulcers Psychiatry: Judgement and insight appear normal. Mood & affect appropriate.   Data Reviewed: I have personally reviewed following labs and imaging studies  CBC:  Recent Labs Lab 04/15/16 1625 04/16/16 0336  WBC 14.3* 12.4*  NEUTROABS 6.5  --   HGB 17.0 15.2  HCT 47.6 42.3  MCV 84.7 84.3  PLT 340 347   Basic Metabolic Panel:  Recent Labs Lab 04/15/16 1625 04/16/16 0336 04/16/16 0723  NA 135 138  --   K 3.0* 4.2  --   CL 102 110  --   CO2 19* 20*  --   GLUCOSE 123* 88  --   BUN 16 12  --   CREATININE 1.14 0.96  --   CALCIUM 9.3 8.6*  --   MG  --   --  2.4   GFR: Estimated Creatinine Clearance:  139.1 mL/min (by C-G formula based on SCr of 0.96 mg/dL). Liver Function Tests:  Recent Labs Lab 04/15/16 1625  AST 34  ALT 36  ALKPHOS 62  BILITOT 0.8  PROT 8.1  ALBUMIN 4.9   No results for input(s): LIPASE, AMYLASE in the last 168 hours. No results for input(s): AMMONIA in the last 168 hours. Coagulation Profile: No results for input(s): INR, PROTIME in the last 168 hours. Cardiac Enzymes: No results for input(s): CKTOTAL, CKMB, CKMBINDEX, TROPONINI in the last 168 hours. BNP (last 3 results) No results for input(s): PROBNP in the last 8760 hours. HbA1C: No results for input(s): HGBA1C in the last 72 hours. CBG: No results for input(s): GLUCAP in the last 168 hours. Lipid Profile: No results for input(s): CHOL, HDL, LDLCALC, TRIG, CHOLHDL, LDLDIRECT in the last 72 hours. Thyroid Function Tests: No results for input(s): TSH, T4TOTAL, FREET4, T3FREE, THYROIDAB in the last 72 hours. Anemia Panel: No results for input(s): VITAMINB12, FOLATE, FERRITIN, TIBC, IRON, RETICCTPCT in the last 72 hours. Urine analysis:    Component Value Date/Time   COLORURINE YELLOW 04/15/2016 1712   APPEARANCEUR CLEAR 04/15/2016 1712   LABSPEC 1.011 04/15/2016 1712   PHURINE 6.0 04/15/2016 1712   GLUCOSEU NEGATIVE 04/15/2016 1712   HGBUR NEGATIVE 04/15/2016 1712   BILIRUBINUR NEGATIVE 04/15/2016 1712   KETONESUR NEGATIVE 04/15/2016 1712   PROTEINUR NEGATIVE 04/15/2016 1712   NITRITE NEGATIVE 04/15/2016 1712   LEUKOCYTESUR NEGATIVE 04/15/2016 1712   Sepsis Labs: @LABRCNTIP (procalcitonin:4,lacticidven:4)   )No results found for this or any previous visit (from the past 240 hour(s)).    Radiology Studies: Dg Chest 2 View Result Date: 04/15/2016 No radiographic evidence of acute cardiopulmonary disease. Signed, Dulcy Fanny. Earleen Newport, DO [facility] Radiology Electronically Signed   By: Corrie Mckusick D.O.   On: 04/15/2016 16:55   Ct Angio Chest  Pe W And/or Wo Contrast Result Date: 04/15/2016 No acute pulmonary embolism on this technically limited examination. Bronchial wall thickening with lower lobe bronchi mucoid impaction associated can be seen with bronchitis or reactive airway disease. Electronically Signed   By: Elon Alas M.D.   On: 04/15/2016 18:48      Scheduled Meds: . [START ON 04/17/2016] azithromycin  250 mg Oral Daily  . enoxaparin (LOVENOX) injection  40 mg Subcutaneous QHS  . piperacillin-tazobactam (ZOSYN)  IV  3.375 g Intravenous Q8H  . sodium chloride flush  3 mL Intravenous Q12H  . vancomycin  1,000 mg Intravenous Q8H   Continuous Infusions: . 0.9 % NaCl with KCl 20 mEq / L 125 mL/hr at 04/16/16 0029     LOS: 0  days    Time spent: 25 minutes  Greater than 50% of the time spent on counseling and coordinating the care.   Leisa Lenz, MD [facility] Pager [phone]  If 7PM-7AM, please contact night-coverage [email or web address] Password [facility] 04/16/2016, 9:54 AM

## 2016-04-16 NOTE — Consult Note (Addendum)
Date of Admission:  04/15/2016  Date of Consult:  04/16/2016  Reason for Consult: HIV positive test Referring Physician: "Auto consult via the [place] system" and Dr Charlies Silvers   HPI: Jared Hunt is an 30 y.o. male who had been feeling relatively well other than some progressive shortness of breath and cough and sinus congestion that commenced approximately a few months ago after he moved into a new apartment where he says there is extensive black mold exposure. He also stopped smoking recently and at times is been having wheezing and is worried that he might have some 4 to of COPD. Apparently he had some presyncopal episodes and per his girlfriend was looking peaked.  He came to the emergency department where he became quite tachycardic and had soft blood pressures.  He had a lactic acid measured on admission was 5.67. White blood cell count was 14,300 with a mix of neutrophils and lymphocytes both over 6000. He was also significantly hypokalemic. He was given volume resuscitation he had a CT angiogram of the chest done which showed no evidence of pulmonary embolism but some mucus plugging and possible findings consistent with bronchitis.  He was started on antibiotics for sepsis protocol with vancomycin and Zosyn along with azithromycin.  His rapid HIV test was positive but a full-fledged fourth generation test has not yet been done during this admission.  He tells me that he tested negative for HIV one month ago as did his girlfriend. He he has girlfriend state that they have been monogamous and had no partners outside of their relationship.  I was alerted to his HIV positive test via the [facility] system.   Note the only other positive pertinent item on review of systems as a persistent fungal infection in his left big toe he believes this is due to mold exposure though I don't think that has anything to do with this. Toes not especially tender on exam.     History  reviewed. No pertinent past medical history.  History reviewed. No pertinent surgical history.  Social History:  reports that he has been smoking.  He has been smoking about 0.50 packs per day. He has never used smokeless tobacco. He reports that he uses drugs, including Marijuana. He reports that he does not drink alcohol.   Family History  Problem Relation Age of Onset  . Osteoarthritis Mother   . Asthma Neg Hx   . Allergic Disorder Neg Hx     Allergies  Allergen Reactions  . Sulfa Antibiotics Anaphylaxis     Medications: I have reviewed patients current medications as documented in Epic Anti-infectives    Start     Dose/Rate Route Frequency Ordered Stop   04/17/16 1000  azithromycin (ZITHROMAX) tablet 250 mg     250 mg Oral Daily 04/16/16 0635 04/21/16 0959   04/16/16 0645  azithromycin (ZITHROMAX) tablet 500 mg     500 mg Oral Daily 04/16/16 0635 04/16/16 0709   04/16/16 0300  vancomycin (VANCOCIN) IVPB 1000 mg/200 mL premix  Status:  Discontinued     1,000 mg 200 mL/hr over 60 Minutes Intravenous Every 8 hours 04/15/16 1858 04/16/16 1431   04/16/16 0000  piperacillin-tazobactam (ZOSYN) IVPB 3.375 g  Status:  Discontinued     3.375 g 12.5 mL/hr over 240 Minutes Intravenous Every 8 hours 04/15/16 1858 04/16/16 1546   04/15/16 1900  vancomycin (VANCOCIN) IVPB 1000 mg/200 mL premix     1,000 mg 200 mL/hr over  60 Minutes Intravenous  Once 04/15/16 1852 04/15/16 2011   04/15/16 1700  piperacillin-tazobactam (ZOSYN) IVPB 3.375 g     3.375 g 100 mL/hr over 30 Minutes Intravenous  Once 04/15/16 1653 04/15/16 1746   04/15/16 1700  vancomycin (VANCOCIN) IVPB 1000 mg/200 mL premix     1,000 mg 200 mL/hr over 60 Minutes Intravenous  Once 04/15/16 1653 04/15/16 1856         ROS:  as in HPI otherwise remainder of 12 point Review of Systems is negative   Blood pressure 113/89, pulse 62, temperature 98.1 F (36.7 C), temperature source Oral, resp. rate 16, height _0  (1.778  m), weight 235 lb 14.3 oz (107 kg), SpO2 97 %. General: Alert and awake, oriented x3, not in any acute distress. HEENT: anicteric sclera,  EOMI, oropharynx clear and without exudate Cardiovascular: regular rate, normal r,  no murmur rubs or gallops Pulmonary: clear to auscultation bilaterally, with some end-expiratory wheezes Gastrointestinal: soft nontender, nondistended, normal bowel sounds, Musculoskeletal: no  clubbing or edema noted bilaterally Skin, soft tissue: no rashes + tatoos   Left toe of 04/16/2016:      Neuro: nonfocal, strength and sensation intact   Results for orders placed or performed during the hospital encounter of 04/15/16 (from the past 48 hour(s))  Basic metabolic panel     Status: Abnormal   Collection Time: 04/15/16  4:25 PM  Result Value Ref Range   Sodium 135 135 - 145 mmol/L   Potassium 3.0 (L) 3.5 - 5.1 mmol/L   Chloride 102 101 - 111 mmol/L   CO2 19 (L) 22 - 32 mmol/L   Glucose, Bld 123 (H) 65 - 99 mg/dL   BUN 16 6 - 20 mg/dL   Creatinine, Ser 1.14 0.61 - 1.24 mg/dL   Calcium 9.3 8.9 - 10.3 mg/dL   GFR calc non Af Amer >60 >60 mL/min   GFR calc Af Amer >60 >60 mL/min    Comment: (NOTE) The eGFR has been calculated using the CKD EPI equation. This calculation has not been validated in all clinical situations. eGFR's persistently <60 mL/min signify possible Chronic Kidney Disease.    Anion gap 14 5 - 15  CBC     Status: Abnormal   Collection Time: 04/15/16  4:25 PM  Result Value Ref Range   WBC 14.3 (H) 4.0 - 10.5 K/uL   RBC 5.62 4.22 - 5.81 MIL/uL   Hemoglobin 17.0 13.0 - 17.0 g/dL   HCT 47.6 39.0 - 52.0 %   MCV 84.7 78.0 - 100.0 fL   MCH 30.2 26.0 - 34.0 pg   MCHC 35.7 30.0 - 36.0 g/dL   RDW 12.5 11.5 - 15.5 %   Platelets 340 150 - 400 K/uL  Hepatic function panel     Status: Abnormal   Collection Time: 04/15/16  4:25 PM  Result Value Ref Range   Total Protein 8.1 6.5 - 8.1 g/dL   Albumin 4.9 3.5 - 5.0 g/dL   AST 34 15 - 41 U/L     ALT 36 17 - 63 U/L   Alkaline Phosphatase 62 38 - 126 U/L   Total Bilirubin 0.8 0.3 - 1.2 mg/dL   Bilirubin, Direct <0.1 (L) 0.1 - 0.5 mg/dL   Indirect Bilirubin NOT CALCULATED 0.3 - 0.9 mg/dL  Differential     Status: Abnormal   Collection Time: 04/15/16  4:25 PM  Result Value Ref Range   Neutrophils Relative % 45 %   Lymphocytes Relative 44 %  Monocytes Relative 8 %   Eosinophils Relative 3 %   Basophils Relative 0 %   Neutro Abs 6.5 1.7 - 7.7 K/uL   Lymphs Abs 6.3 (H) 0.7 - 4.0 K/uL   Monocytes Absolute 1.1 (H) 0.1 - 1.0 K/uL   Eosinophils Absolute 0.4 0.0 - 0.7 K/uL   Basophils Absolute 0.0 0.0 - 0.1 K/uL   Smear Review MORPHOLOGY UNREMARKABLE   I-stat troponin, ED     Status: None   Collection Time: 04/15/16  4:39 PM  Result Value Ref Range   Troponin i, poc 0.00 0.00 - 0.08 ng/mL   Comment 3            Comment: Due to the release kinetics of cTnI, a negative result within the first hours of the onset of symptoms does not rule out myocardial infarction with certainty. If myocardial infarction is still suspected, repeat the test at appropriate intervals.   I-Stat CG4 Lactic Acid, ED     Status: Abnormal   Collection Time: 04/15/16  4:42 PM  Result Value Ref Range   Lactic Acid, Venous 5.63 (HH) 0.5 - 1.9 mmol/L   Comment NOTIFIED PHYSICIAN   Culture, blood (Routine X 2) w Reflex to ID Panel     Status: None (Preliminary result)   Collection Time: 04/15/16  5:10 PM  Result Value Ref Range   Specimen Description BLOOD BLOOD LEFT FOREARM    Special Requests IN PEDIATRIC BOTTLE 2CC    Culture      NO GROWTH < 24 HOURS Performed at [facility]    Report Status PENDING   Urinalysis, Routine w reflex microscopic (not at [facility])     Status: None   Collection Time: 04/15/16  5:12 PM  Result Value Ref Range   Color, Urine YELLOW YELLOW   APPearance CLEAR CLEAR   Specific Gravity, Urine 1.011 1.005 - 1.030   pH 6.0 5.0 - 8.0   Glucose, UA NEGATIVE NEGATIVE  mg/dL   Hgb urine dipstick NEGATIVE NEGATIVE   Bilirubin Urine NEGATIVE NEGATIVE   Ketones, ur NEGATIVE NEGATIVE mg/dL   Protein, ur NEGATIVE NEGATIVE mg/dL   Nitrite NEGATIVE NEGATIVE   Leukocytes, UA NEGATIVE NEGATIVE    Comment: MICROSCOPIC NOT DONE ON URINES WITH NEGATIVE PROTEIN, BLOOD, LEUKOCYTES, NITRITE, OR GLUCOSE <1000 mg/dL.  Urine culture     Status: None   Collection Time: 04/15/16  5:12 PM  Result Value Ref Range   Specimen Description URINE, CLEAN CATCH    Special Requests NONE    Culture NO GROWTH Performed at [facility]     Report Status 04/16/2016 FINAL   Rapid urine drug screen (hospital performed)     Status: Abnormal   Collection Time: 04/15/16  5:12 PM  Result Value Ref Range   Opiates NONE DETECTED NONE DETECTED   Cocaine NONE DETECTED NONE DETECTED   Benzodiazepines NONE DETECTED NONE DETECTED   Amphetamines NONE DETECTED NONE DETECTED   Tetrahydrocannabinol POSITIVE (A) NONE DETECTED   Barbiturates NONE DETECTED NONE DETECTED    Comment:        DRUG SCREEN FOR MEDICAL PURPOSES ONLY.  IF CONFIRMATION IS NEEDED FOR ANY PURPOSE, NOTIFY LAB WITHIN 5 DAYS.        LOWEST DETECTABLE LIMITS FOR URINE DRUG SCREEN Drug Class       Cutoff (ng/mL) Amphetamine      1000 Barbiturate      200 Benzodiazepine   833 Tricyclics       825 Opiates  300 Cocaine          300 THC              50   Procalcitonin     Status: None   Collection Time: 04/15/16  5:39 PM  Result Value Ref Range   Procalcitonin <0.10 ng/mL    Comment:        Interpretation: PCT (Procalcitonin) <= 0.5 ng/mL: Systemic infection (sepsis) is not likely. Local bacterial infection is possible. (NOTE)         ICU PCT Algorithm               Non ICU PCT Algorithm    ----------------------------     ------------------------------         PCT < 0.25 ng/mL                 PCT < 0.1 ng/mL     Stopping of antibiotics            Stopping of antibiotics       strongly encouraged.                strongly encouraged.    ----------------------------     ------------------------------       PCT level decrease by               PCT < 0.25 ng/mL       >= 80% from peak PCT       OR PCT 0.25 - 0.5 ng/mL          Stopping of antibiotics                                             encouraged.     Stopping of antibiotics           encouraged.    ----------------------------     ------------------------------       PCT level decrease by              PCT >= 0.25 ng/mL       < 80% from peak PCT        AND PCT >= 0.5 ng/mL            Continuin g antibiotics                                              encouraged.       Continuing antibiotics            encouraged.    ----------------------------     ------------------------------     PCT level increase compared          PCT > 0.5 ng/mL         with peak PCT AND          PCT >= 0.5 ng/mL             Escalation of antibiotics                                          strongly encouraged.      Escalation of antibiotics  strongly encouraged.   Influenza panel by PCR (type A & B, H1N1)     Status: None   Collection Time: 04/15/16  8:21 PM  Result Value Ref Range   Influenza A By PCR NEGATIVE NEGATIVE   Influenza B By PCR NEGATIVE NEGATIVE    Comment: (NOTE) The Xpert Xpress Flu assay is intended as an aid in the diagnosis of  influenza and should not be used as a sole basis for treatment.  This  assay is FDA approved for nasopharyngeal swab specimens only. Nasal  washings and aspirates are unacceptable for Xpert Xpress Flu testing.   I-Stat CG4 Lactic Acid, ED     Status: None   Collection Time: 04/15/16  8:37 PM  Result Value Ref Range   Lactic Acid, Venous 1.37 0.5 - 1.9 mmol/L  Culture, blood (Routine X 2) w Reflex to ID Panel     Status: None (Preliminary result)   Collection Time: 04/15/16  8:53 PM  Result Value Ref Range   Specimen Description BLOOD BLOOD LEFT FOREARM    Special Requests BOTTLES DRAWN AEROBIC  AND ANAEROBIC 5CC    Culture      NO GROWTH < 24 HOURS Performed at [facility]    Report Status PENDING   CBC     Status: Abnormal   Collection Time: 04/16/16  3:36 AM  Result Value Ref Range   WBC 12.4 (H) 4.0 - 10.5 K/uL   RBC 5.02 4.22 - 5.81 MIL/uL   Hemoglobin 15.2 13.0 - 17.0 g/dL   HCT 42.3 39.0 - 52.0 %   MCV 84.3 78.0 - 100.0 fL   MCH 30.3 26.0 - 34.0 pg   MCHC 35.9 30.0 - 36.0 g/dL   RDW 12.6 11.5 - 15.5 %   Platelets 247 150 - 400 K/uL  Basic metabolic panel     Status: Abnormal   Collection Time: 04/16/16  3:36 AM  Result Value Ref Range   Sodium 138 135 - 145 mmol/L   Potassium 4.2 3.5 - 5.1 mmol/L    Comment: DELTA CHECK NOTED REPEATED TO VERIFY NO VISIBLE HEMOLYSIS    Chloride 110 101 - 111 mmol/L   CO2 20 (L) 22 - 32 mmol/L   Glucose, Bld 88 65 - 99 mg/dL   BUN 12 6 - 20 mg/dL   Creatinine, Ser 0.96 0.61 - 1.24 mg/dL   Calcium 8.6 (L) 8.9 - 10.3 mg/dL   GFR calc non Af Amer >60 >60 mL/min   GFR calc Af Amer >60 >60 mL/min    Comment: (NOTE) The eGFR has been calculated using the CKD EPI equation. This calculation has not been validated in all clinical situations. eGFR's persistently <60 mL/min signify possible Chronic Kidney Disease.    Anion gap 8 5 - 15  Rapid HIV screen (HIV 1/2 Ab+Ag)     Status: Abnormal   Collection Time: 04/16/16  3:36 AM  Result Value Ref Range   HIV-1 P24 Antigen - HIV24 NON REACTIVE NON REACTIVE   HIV 1/2 Antibodies Reactive (A) NON REACTIVE    Comment: RESULT CALLED TO, READ BACK BY AND VERIFIED WITH: Martina Sinner RN [id number] 04/16/16 A NAVARRO REPEATED TO VERIFY    Interpretation (HIV Ag Ab)      A reactive test result means that HIV 1 or HIV 2 antibodies have been detected in the specimen. The test result is interpreted as Preliminary Positive for HIV 1 and/or HIV 2 antibodies.    Comment: SENT FOR CONFIRMATION  Magnesium     Status: None   Collection Time: 04/16/16  7:23 AM  Result Value Ref Range   Magnesium  2.4 1.7 - 2.4 mg/dL   _0 (sdes,specrequest,cult,reptstatus)   ) Recent Results (from the past 720 hour(s))  Culture, blood (Routine X 2) w Reflex to ID Panel     Status: None (Preliminary result)   Collection Time: 04/15/16  5:10 PM  Result Value Ref Range Status   Specimen Description BLOOD BLOOD LEFT FOREARM  Final   Special Requests IN PEDIATRIC BOTTLE [place]  Final   Culture   Final    NO GROWTH < 24 HOURS Performed at [facility]    Report Status PENDING  Incomplete  Urine culture     Status: None   Collection Time: 04/15/16  5:12 PM  Result Value Ref Range Status   Specimen Description URINE, CLEAN CATCH  Final   Special Requests NONE  Final   Culture NO GROWTH Performed at [facility]   Final   Report Status 04/16/2016 FINAL  Final  Culture, blood (Routine X 2) w Reflex to ID Panel     Status: None (Preliminary result)   Collection Time: 04/15/16  8:53 PM  Result Value Ref Range Status   Specimen Description BLOOD BLOOD LEFT FOREARM  Final   Special Requests BOTTLES DRAWN AEROBIC AND ANAEROBIC 5CC  Final   Culture   Final    NO GROWTH < 24 HOURS Performed at [facility]    Report Status PENDING  Incomplete     Impression/Recommendation  Principal Problem:   Sepsis ([place]) Active Problems:   Hypokalemia   CARTHEL CASTILLE is a 30 y.o. male with  adssion for cough presyncopal symptoms wheezing who was treated with broad-spectrum antibiotic for possible sepsis given his lactic acidosis and soft blood pressure and tachycardia.  #1 possible sepsis: I don't think he has sepsis I don't now to explain all of his admission labs generally his tachycardia could be due to anxiety and his pleurisy associated with his cough. His blood pressure is normal to hypertensive at present. I don't know why he would've has a significant lactic acidosis but a blood draw mistake could also explain this.  I'm discontinuing his vancomycin and Zosyn.  #2  bronchitis I will continue his azithromycin  #3 HIV positive test on rapid test: I had already ordered an HIV ultra quantitative RNA O also order a fourth generation antibody antigen test which may come back for the RNA test.  #4 STI testing testing for hepatitis C and hepatitis B  #5 onychomycosis could be treated with Lamisil if he can abstain from other potential hepatotoxic drugs including alcohol.   04/16/2016, 5:14 PM   Thank you so much for this interesting consult  [place] [facility] [place] [phone] (pager) [phone] (office) 04/16/2016, 5:14 PM  Rhina [place] 04/16/2016, 5:14 PM

## 2016-04-16 NOTE — Progress Notes (Signed)
CRITICAL VALUE ALERT  Critical value received:  Positive reactive antibody to HIV  Date of notification:  04/16/16  Time of notification:  0555  Critical value read back:yes  Nurse who received alert:  Dellie Catholic  MD notified (1st page): yes  Time of first page:  [phone]

## 2016-04-17 DIAGNOSIS — R768 Other specified abnormal immunological findings in serum: Secondary | ICD-10-CM

## 2016-04-17 DIAGNOSIS — J209 Acute bronchitis, unspecified: Secondary | ICD-10-CM

## 2016-04-17 LAB — HIV-1 RNA ULTRAQUANT REFLEX TO GENTYP+
HIV-1 RNA BY PCR: <20 copies/mL
HIV-1 RNA Quant, Log: UNDETERMINED log10copy/mL

## 2016-04-17 LAB — CBC
HEMATOCRIT: 44.7 % (ref 39.0–52.0)
HEMOGLOBIN: 15.6 g/dL (ref 13.0–17.0)
MCH: 30.5 pg (ref 26.0–34.0)
MCHC: 34.9 g/dL (ref 30.0–36.0)
MCV: 87.3 fL (ref 78.0–100.0)
Platelets: 244 10*3/uL (ref 150–400)
RBC: 5.12 MIL/uL (ref 4.22–5.81)
RDW: 12.8 % (ref 11.5–15.5)
WBC: 7.4 10*3/uL (ref 4.0–10.5)

## 2016-04-17 LAB — CD4/CD8 (T-HELPER/T-SUPPRESSOR CELL)
CD4 ABSOLUTE: 1340 /uL (ref 500–1900)
CD4%: 53 % (ref 30.0–60.0)
CD8 T CELL ABS: 410 /uL (ref 230–1000)
CD8TOX: 16 % (ref 15.0–40.0)
Ratio: 3.3 — ABNORMAL HIGH (ref 1.0–3.0)
TOTAL LYMPHOCYTE COUNT: 2513 /uL (ref 1000–4000)

## 2016-04-17 LAB — BASIC METABOLIC PANEL
Anion gap: 5 (ref 5–15)
BUN: 13 mg/dL (ref 6–20)
CALCIUM: 8.5 mg/dL — AB (ref 8.9–10.3)
CHLORIDE: 107 mmol/L (ref 101–111)
CO2: 24 mmol/L (ref 22–32)
CREATININE: 0.98 mg/dL (ref 0.61–1.24)
GFR calc Af Amer: >60 mL/min (ref >60)
GFR calc non Af Amer: >60 mL/min (ref >60)
Glucose, Bld: 76 mg/dL (ref 65–99)
Potassium: 3.9 mmol/L (ref 3.5–5.1)
Sodium: 136 mmol/L (ref 135–145)

## 2016-04-17 LAB — HIV ANTIBODY (ROUTINE TESTING W REFLEX): HIV SCREEN 4TH GENERATION: NONREACTIVE

## 2016-04-17 LAB — PROCALCITONIN: Procalcitonin: <0.1 ng/mL

## 2016-04-17 MED ORDER — ALPRAZOLAM 1 MG PO TABS
1.0000 mg | ORAL_TABLET | Freq: Three times a day (TID) | ORAL | Status: DC | PRN
  Administered 2016-04-17: 1 mg via ORAL
  Filled 2016-04-17: qty 1

## 2016-04-17 MED ORDER — GUAIFENESIN ER 600 MG PO TB12
600.0000 mg | ORAL_TABLET | Freq: Two times a day (BID) | ORAL | Status: DC
  Administered 2016-04-17: 600 mg via ORAL
  Filled 2016-04-17: qty 1

## 2016-04-17 MED ORDER — AZITHROMYCIN 250 MG PO TABS: 250.0000 mg | ORAL_TABLET | Freq: Every day | ORAL | 0 refills | Status: DC

## 2016-04-17 NOTE — Progress Notes (Signed)
Subjective: No new complaints   Antibiotics:  Anti-infectives    Start     Dose/Rate Route Frequency Ordered Stop   04/17/16 1000  azithromycin (ZITHROMAX) tablet 250 mg     250 mg Oral Daily 04/16/16 0635 04/21/16 0959   04/17/16 0000  azithromycin (ZITHROMAX) 250 MG tablet     250 mg Oral Daily 04/17/16 1823     04/16/16 0645  azithromycin (ZITHROMAX) tablet 500 mg     500 mg Oral Daily 04/16/16 0635 04/16/16 0709   04/16/16 0300  vancomycin (VANCOCIN) IVPB 1000 mg/200 mL premix  Status:  Discontinued     1,000 mg 200 mL/hr over 60 Minutes Intravenous Every 8 hours 04/15/16 1858 04/16/16 1431   04/16/16 0000  piperacillin-tazobactam (ZOSYN) IVPB 3.375 g  Status:  Discontinued     3.375 g 12.5 mL/hr over 240 Minutes Intravenous Every 8 hours 04/15/16 1858 04/16/16 1546   04/15/16 1900  vancomycin (VANCOCIN) IVPB 1000 mg/200 mL premix     1,000 mg 200 mL/hr over 60 Minutes Intravenous  Once 04/15/16 1852 04/15/16 2011   04/15/16 1700  piperacillin-tazobactam (ZOSYN) IVPB 3.375 g     3.375 g 100 mL/hr over 30 Minutes Intravenous  Once 04/15/16 1653 04/15/16 1746   04/15/16 1700  vancomycin (VANCOCIN) IVPB 1000 mg/200 mL premix     1,000 mg 200 mL/hr over 60 Minutes Intravenous  Once 04/15/16 1653 04/15/16 1856      Medications: Scheduled Meds: . azithromycin  250 mg Oral Daily  . enoxaparin (LOVENOX) injection  0.5 mg/kg Subcutaneous QHS  . guaiFENesin  600 mg Oral BID  . sodium chloride flush  3 mL Intravenous Q12H   Continuous Infusions: PRN Meds:.acetaminophen **OR** acetaminophen, albuterol, ALPRAZolam, ondansetron **OR** ondansetron (ZOFRAN) IV    Objective: Weight change:   Intake/Output Summary (Last 24 hours) at 04/17/16 1934 Last data filed at 04/17/16 1400  Gross per 24 hour  Intake             1250 ml  Output                0 ml  Net             1250 ml   Blood pressure 110/75, pulse 81, temperature 98 F (36.7 C), temperature source Oral,  resp. rate 18, height 5' 10"  (1.778 m), weight 235 lb 14.3 oz (107 kg), SpO2 99 %. Temp:  [97.6 F (36.4 C)-98 F (36.7 C)] 98 F (36.7 C) (11/24 1500) Pulse Rate:  [78-102] 81 (11/24 1500) Resp:  [18] 18 (11/24 1500) BP: (92-129)/(54-75) 110/75 (11/24 1500) SpO2:  [98 %-99 %] 99 % (11/24 1500)  Physical Exam: General: Alert and awake, oriented x3, not in any acute distress. HEENT: anicteric sclera,  EOMI CVS regular rate, normal r Chest:  no wheezing, rales or rhonchi Abdomen: soft nontender, nondistended,  Skin: Tattoos Neuro: nonfocal  CBC: CBC Latest Ref Rng & Units 04/17/2016 04/16/2016 04/15/2016  WBC 4.0 - 10.5 K/uL 7.4 12.4(H) 14.3(H)  Hemoglobin 13.0 - 17.0 g/dL 15.6 15.2 17.0  Hematocrit 39.0 - 52.0 % 44.7 42.3 47.6  Platelets 150 - 400 K/uL 244 247 340     BMET  Recent Labs  04/16/16 0336 04/17/16 0513  NA 138 136  K 4.2 3.9  CL 110 107  CO2 20* 24  GLUCOSE 88 76  BUN 12 13  CREATININE 0.96 0.98  CALCIUM 8.6* 8.5*     Liver Panel  Recent Labs  04/15/16 1625  PROT 8.1  ALBUMIN 4.9  AST 34  ALT 36  ALKPHOS 62  BILITOT 0.8  BILIDIR <0.1*  IBILI NOT CALCULATED       Sedimentation Rate No results for input(s): ESRSEDRATE in the last 72 hours. C-Reactive Protein No results for input(s): CRP in the last 72 hours.  Micro Results: Recent Results (from the past 720 hour(s))  Culture, blood (Routine X 2) w Reflex to ID Panel     Status: None (Preliminary result)   Collection Time: 04/15/16  5:10 PM  Result Value Ref Range Status   Specimen Description BLOOD BLOOD LEFT FOREARM  Final   Special Requests IN PEDIATRIC BOTTLE [place]  Final   Culture   Final    NO GROWTH 2 DAYS Performed at [facility]    Report Status PENDING  Incomplete  Urine culture     Status: None   Collection Time: 04/15/16  5:12 PM  Result Value Ref Range Status   Specimen Description URINE, CLEAN CATCH  Final   Special Requests NONE  Final   Culture NO  GROWTH Performed at [facility]   Final   Report Status 04/16/2016 FINAL  Final  Culture, blood (Routine X 2) w Reflex to ID Panel     Status: None (Preliminary result)   Collection Time: 04/15/16  8:53 PM  Result Value Ref Range Status   Specimen Description BLOOD BLOOD LEFT FOREARM  Final   Special Requests BOTTLES DRAWN AEROBIC AND ANAEROBIC 5CC  Final   Culture   Final    NO GROWTH 1 DAY Performed at [facility]    Report Status PENDING  Incomplete    Studies/Results: No results found.    Assessment/Plan:  INTERVAL HISTORY: HIV fourth generation test and HIV quantitative RNA both came back negative   Principal Problem:   Sepsis ([place]) Active Problems:   Hypokalemia   HIV antibody positive ([place])   Tattoo   Former smoker   Wheezing   Acute bronchitis   Lactic acidosis   Onychomycosis    Jared Hunt is a 30 y.o. male with  mission for was thought to be sepsis who also had HIV positive antibody.  #1 HIV positive antibody: This was a false-positive rapid test. HIV fourth generation test and quantitative RNA were negative.  I gave this information to the patient and his girlfriend who were related to find the test was a false positive test.  #2 questionable sepsis: I don't like he had sepsis. Really no identifiable cause has been found per record   #3 bronchitis: Finish Z-Pak  #4 screening I will follow-up with his hep B and C tests if they're positive and arrange for follow-up in my clinic   LOS: 0 days   Alcide Evener 04/17/2016, 7:34 PM

## 2016-04-17 NOTE — Discharge Summary (Signed)
Physician Discharge Summary  Jared Hunt HCW:[id number] DOB: 1986/02/22 DOA: 04/15/2016  PCP: No primary care provider on file.  Admit date: 04/15/2016 Discharge date: 04/17/2016  Recommendations for Outpatient Follow-up:  1. 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 He reported he moved into a new apartment which seemed it had lots of mold and since he lives there he started to notice his symptoms just progressively got worse. About one day prior to this admission he started to feel very weak and dizzy which prompted him to come to ED for evaluation.  In ED, pt was hemodynamically stable. Blood work showed leukocytosis of 14.3, lactic acid 5.63 which on subsequent lab it normalized. Chest x-ray showed no acute cardiopulmonary findings. CT angiogram of the chest ruled out pulmonary embolism. He was started empirically on vancomycin and Zosyn as well as azithromycin for treatment of sepsis secondary to possible bronchitis or pneumonia.  Hospital Course:     Assessment & Plan:   Principal Problem:   Sepsis secondary to bronchitis versus pneumonia ([place]) / leukocytosis - Sepsis criteria met on the admission with tachycardia, tachypnea, leukocytosis and lactic acidosis with source of infection likely bronchitis versus pneumonia - Patient started on empiric azithromycin, vancomycin and Zosyn - Per ID, stopped vanco and zosyn and now on azithro only - will continue for 3 days on discharge   - Blood culture and urine culture negative    Active Problems: HIV test positive  - HIV result is positive on rapid test but false positive as HIV ab non reactive  - Hep B and C follow up on outpt basis    Hypokalemia - Likely from sepsis, subsequently normalized   DVT prophylaxis: Lovenox subQ  Code Status: full code  Family Communication: girlfriend at the bedside   Consultants:   ID   Procedures:   None   Antimicrobials:   Azithromycin 04/15/2016 -->  Zosyn 04/15/2016 --> 04/16/16  Vanco 04/15/2016 --> 04/16/16     Signed:  Leisa Lenz, MD  [facility] 04/17/2016, 6:23 PM  Pager #: [phone]  Time spent in minutes: less than 30 minutes    Discharge Exam: Vitals:   04/17/16 0440 04/17/16 1500  BP: (!) 92/54 110/75  Pulse: 78 81  Resp: 18 18  Temp: 97.6 F (36.4 C) 98 F (36.7 C)   Vitals:   04/16/16 1559 04/16/16 2010 04/17/16 0440 04/17/16 1500  BP: 113/89 129/65 (!) 92/54 110/75  Pulse: 62 (!) 102 78 81  Resp: 16 18 18 18   Temp: 98.1 F (36.7 C) 98 F (36.7 C) 97.6 F (36.4 C) 98 F (36.7 C)  TempSrc: Oral Oral Oral Oral  SpO2: 97% 98% 99% 99%  Weight:      Height:        General: Pt is alert, follows commands appropriately, not in acute distress Cardiovascular: Regular rate and rhythm, S1/S2 +, no murmurs Respiratory: Clear to auscultation bilaterally, no wheezing, no crackles, no rhonchi Abdominal: Soft, non tender, non  distended, bowel sounds +, no guarding Extremities: no edema, no cyanosis, pulses palpable bilaterally DP and PT Neuro: Grossly nonfocal  Discharge Instructions  Discharge Instructions    Call MD for:  persistant nausea and vomiting    Complete by:  As directed    Call MD for:  redness, tenderness, or signs of infection (pain, swelling, redness, odor or green/yellow discharge around incision site)    Complete by:  As directed    Call MD for:  severe uncontrolled pain    Complete by:  As  directed    Diet - low sodium heart healthy    Complete by:  As directed    Increase activity slowly    Complete by:  As directed        Medication List    STOP taking these medications   Chlorella Powd   UNABLE TO FIND     TAKE these medications   azithromycin 250 MG tablet Commonly known as:  ZITHROMAX Take 1 tablet (250 mg total) by mouth daily.   Creatine Monohydrate Powd Take by mouth See admin instructions. Take 1 gram daily.   Garlic 7948 MG Caps Take 2,000 mg by mouth daily.   guaiFENesin 600 MG 12 hr tablet Commonly known as:  MUCINEX Take 600 mg by mouth 2 (two) times daily as needed for cough or to loosen phlegm.   L-Methionine Powd Take by mouth daily. As directed.   MAGNESIUM PO Take 1 tablet by mouth daily as needed (magnesium supplementation.).   Turmeric Curcumin Caps Take 1 capsule by mouth daily. 1000 mg.   MISC NATURAL PRODUCTS PO Take 1 capsule by mouth daily. "SerraGold."  Contains per product label: 18 mg calcium citrate , magnesium citrate 0.1 mg, 80,000 HUT protease, and 100,000 SPU serrapeptase.         The results of significant diagnostics from this hospitalization (including imaging, microbiology, ancillary and laboratory) are listed below for reference.    Significant Diagnostic Studies: Dg Chest 2 View  Result Date: 04/15/2016 CLINICAL DATA:  30 year old male with a history of shortness of breath and tachypnea EXAM: CHEST  2 VIEW COMPARISON:  08/22/2014 FINDINGS: Cardiomediastinal silhouette unchanged in size and contour. No evidence of central vascular congestion. No pneumothorax or pleural effusion. No displaced fracture. IMPRESSION: No radiographic evidence of acute cardiopulmonary disease. Signed, Dulcy Fanny. Earleen Newport, DO [facility] Radiology Electronically Signed   By: Corrie Mckusick D.O.   On: 04/15/2016 16:55   Ct Angio Chest Pe W And/or Wo Contrast  Result Date:  04/15/2016 CLINICAL DATA:  Shortness of breath and tachycardia. EXAM: CT ANGIOGRAPHY CHEST WITH CONTRAST TECHNIQUE: Multidetector CT imaging of the chest was performed using the standard protocol during bolus administration of intravenous contrast. Multiplanar CT image reconstructions and MIPs were obtained to evaluate the vascular anatomy. CONTRAST:  100 cc of Isovue 370 COMPARISON:  Chest radiograph April 15, 2016 at 1648 hours FINDINGS: CARDIOVASCULAR: Suboptimal contrast opacification of the pulmonary artery's (165 Hounsfield units, target is 250 Hounsfield units. Main pulmonary artery is not enlarged. No pulmonary arterial filling defects to the level of the proximal segmental branches. Heart size is normal, no right heart strain. No pericardial effusions. Thoracic aorta is normal course and caliber, unremarkable. MEDIASTINUM/NODES: No lymphadenopathy by CT size criteria. LUNGS/PLEURA: Mild bronchial wall thickening with mucoid impaction in the lower lobes. No pneumothorax. Small pleural effusion or focal consolidation. 3 mm RIGHT lower lobe pulmonary nodule, no indicated follow-up due to patient's young age.  UPPER ABDOMEN: Included view of the abdomen is unremarkable. MUSCULOSKELETAL: Visualized soft tissues and included osseous structures appear normal. Review of the MIP images confirms the above findings. IMPRESSION: No acute pulmonary embolism on this technically limited examination. Bronchial wall thickening with lower lobe bronchi mucoid impaction associated can be seen with bronchitis or reactive airway disease. Electronically Signed   By: Elon Alas M.D.   On: 04/15/2016 18:48    Microbiology: Recent Results (from the past 240 hour(s))  Culture, blood (Routine X 2) w Reflex to ID Panel     Status: None (Preliminary result)   Collection Time: 04/15/16  5:10 PM  Result Value Ref Range Status   Specimen Description BLOOD BLOOD LEFT FOREARM  Final   Special Requests IN PEDIATRIC BOTTLE  [place]  Final   Culture   Final    NO GROWTH 2 DAYS Performed at [facility]    Report Status PENDING  Incomplete  Urine culture     Status: None   Collection Time: 04/15/16  5:12 PM  Result Value Ref Range Status   Specimen Description URINE, CLEAN CATCH  Final   Special Requests NONE  Final   Culture NO GROWTH Performed at [facility]   Final   Report Status 04/16/2016 FINAL  Final  Culture, blood (Routine X 2) w Reflex to ID Panel     Status: None (Preliminary result)   Collection Time: 04/15/16  8:53 PM  Result Value Ref Range Status   Specimen Description BLOOD BLOOD LEFT FOREARM  Final   Special Requests BOTTLES DRAWN AEROBIC AND ANAEROBIC 5CC  Final   Culture   Final    NO GROWTH 1 DAY Performed at [facility]    Report Status PENDING  Incomplete     Labs: Basic Metabolic Panel:  Recent Labs Lab 04/15/16 1625 04/16/16 0336 04/16/16 0723 04/17/16 0513  NA 135 138  --  136  K 3.0* 4.2  --  3.9  CL 102 110  --  107  CO2 19* 20*  --  24  GLUCOSE 123* 88  --  76  BUN 16 12  --  13  CREATININE 1.14 0.96  --  0.98  CALCIUM 9.3 8.6*  --  8.5*  MG  --   --  2.4  --    Liver Function Tests:  Recent Labs Lab 04/15/16 1625  AST 34  ALT 36  ALKPHOS 62  BILITOT 0.8  PROT 8.1  ALBUMIN 4.9   No results for input(s): LIPASE, AMYLASE in the last 168 hours. No results for input(s): AMMONIA in the last 168 hours. CBC:  Recent Labs Lab 04/15/16 1625 04/16/16 0336 04/17/16 0513  WBC 14.3* 12.4* 7.4  NEUTROABS 6.5  --   --   HGB 17.0 15.2 15.6  HCT 47.6 42.3 44.7  MCV 84.7 84.3 87.3  PLT 340 247 244   Cardiac Enzymes: No results for input(s): CKTOTAL, CKMB, CKMBINDEX, TROPONINI in the last 168 hours. BNP: BNP (last 3 results) No results for input(s): BNP in the last 8760 hours.  ProBNP (last 3 results) No results for input(s): PROBNP in the last 8760 hours.  CBG: No results for input(s): GLUCAP in the last 168 hours.

## 2016-04-17 NOTE — Progress Notes (Signed)
Patient ID: Jared Hunt, male   DOB: June 04, 1985, 30 y.o.   MRN: [id number]  PROGRESS NOTE    Jared Hunt  GYI:[id number] DOB: Jun 19, 1985 DOA: 04/15/2016  PCP: No primary care provider on file.   Brief Narrative:  30 y.o. male with no significant past medical history who presented to [facility] long hospital with progressive shortness of breath and cough over past few months prior to this admission. He reported he moved into a new apartment which seemed it had lots of mold and since he lives there he started to notice his symptoms just progressively got worse. About one day prior to this admission he started to feel very weak and dizzy which prompted him to come to ED for evaluation.  In ED, pt was hemodynamically stable. Blood work showed leukocytosis of 14.3, lactic acid 5.63 which on subsequent lab it normalized. Chest x-ray showed no acute cardiopulmonary findings. CT angiogram of the chest ruled out pulmonary embolism. He was started empirically on vancomycin and Zosyn as well as azithromycin for treatment of sepsis secondary to possible bronchitis or pneumonia.   Assessment & Plan:   Principal Problem:   Sepsis secondary to bronchitis versus pneumonia ([facility]) / leukocytosis - Sepsis criteria met on the admission with tachycardia, tachypnea, leukocytosis and lactic acidosis with source of infection likely bronchitis versus pneumonia - Patient started on empiric azithromycin, vancomycin and Zosyn - Per ID, stopped vanco and ozsyn and now on azithro only  - Blood culture and urine culture negative    Active Problems: HIV test positive  - HIV result is positive on rapid test; ID has seen the pt in consultation and ordered HIV ultra quantitative RNA and fourth generation antibody antigen  test  - Hep B and C pending     Hypokalemia - Likely from sepsis, subsequently normalized   DVT prophylaxis: Lovenox subQ  Code Status: full code  Family Communication: girlfriend at the  bedside Disposition Plan: home once all results from above tests back    Consultants:   ID   Procedures:   None   Antimicrobials:   Azithromycin 04/15/2016 -->  Zosyn 04/15/2016 --> 04/16/16  Vanco 04/15/2016 --> 04/16/16    Subjective: No overnight events.   Objective: Vitals:   04/16/16 0415 04/16/16 1559 04/16/16 2010 04/17/16 0440  BP: 103/73 113/89 129/65 (!) 92/54  Pulse: 72 62 (!) 102 78  Resp:  16 18 18   Temp: 97.9 F (36.6 C) 98.1 F (36.7 C) 98 F (36.7 C) 97.6 F (36.4 C)  TempSrc: Oral Oral Oral Oral  SpO2: 100% 97% 98% 99%  Weight:      Height:        Intake/Output Summary (Last 24 hours) at 04/17/16 1117 Last data filed at 04/17/16 1009  Gross per 24 hour  Intake             1490 ml  Output                0 ml  Net             1490 ml   Filed Weights   04/15/16 1713 04/15/16 2334  Weight: 108.9 kg (240 lb) 107 kg (235 lb 14.3 oz)    Examination:  General exam: Appears calm and comfortable, no distress   Respiratory system: No wheezing, no rhonchi  Cardiovascular system: S1 & S2 heard, Rate controlled  Gastrointestinal system: (+) BS, non tender abd  Central nervous system: No focal neurological deficits. Extremities: No  edema, no tenderness  Skin: No rashes, lesions or ulcers, skin warm and dry  Psychiatry: Mood & affect appropriate.   Data Reviewed: I have personally reviewed following labs and imaging studies  CBC:  Recent Labs Lab 04/15/16 1625 04/16/16 0336 04/17/16 0513  WBC 14.3* 12.4* 7.4  NEUTROABS 6.5  --   --   HGB 17.0 15.2 15.6  HCT 47.6 42.3 44.7  MCV 84.7 84.3 87.3  PLT 340 247 481   Basic Metabolic Panel:  Recent Labs Lab 04/15/16 1625 04/16/16 0336 04/16/16 0723 04/17/16 0513  NA 135 138  --  136  K 3.0* 4.2  --  3.9  CL 102 110  --  107  CO2 19* 20*  --  24  GLUCOSE 123* 88  --  76  BUN 16 12  --  13  CREATININE 1.14 0.96  --  0.98  CALCIUM 9.3 8.6*  --  8.5*  MG  --   --  2.4  --     GFR: Estimated Creatinine Clearance: 136.2 mL/min (by C-G formula based on SCr of 0.98 mg/dL). Liver Function Tests:  Recent Labs Lab 04/15/16 1625  AST 34  ALT 36  ALKPHOS 62  BILITOT 0.8  PROT 8.1  ALBUMIN 4.9   No results for input(s): LIPASE, AMYLASE in the last 168 hours. No results for input(s): AMMONIA in the last 168 hours. Coagulation Profile: No results for input(s): INR, PROTIME in the last 168 hours. Cardiac Enzymes: No results for input(s): CKTOTAL, CKMB, CKMBINDEX, TROPONINI in the last 168 hours. BNP (last 3 results) No results for input(s): PROBNP in the last 8760 hours. HbA1C: No results for input(s): HGBA1C in the last 72 hours. CBG: No results for input(s): GLUCAP in the last 168 hours. Lipid Profile: No results for input(s): CHOL, HDL, LDLCALC, TRIG, CHOLHDL, LDLDIRECT in the last 72 hours. Thyroid Function Tests: No results for input(s): TSH, T4TOTAL, FREET4, T3FREE, THYROIDAB in the last 72 hours. Anemia Panel: No results for input(s): VITAMINB12, FOLATE, FERRITIN, TIBC, IRON, RETICCTPCT in the last 72 hours. Urine analysis:    Component Value Date/Time   COLORURINE YELLOW 04/15/2016 [place] 04/15/2016 1712   LABSPEC 1.011 04/15/2016 1712   PHURINE 6.0 04/15/2016 1712   GLUCOSEU NEGATIVE 04/15/2016 1712   HGBUR NEGATIVE 04/15/2016 1712   BILIRUBINUR NEGATIVE 04/15/2016 1712   KETONESUR NEGATIVE 04/15/2016 1712   PROTEINUR NEGATIVE 04/15/2016 1712   NITRITE NEGATIVE 04/15/2016 1712   LEUKOCYTESUR NEGATIVE 04/15/2016 1712   Sepsis Labs: @LABRCNTIP (procalcitonin:4,lacticidven:4)   ) Recent Results (from the past 240 hour(s))  Culture, blood (Routine X 2) w Reflex to ID Panel     Status: None (Preliminary result)   Collection Time: 04/15/16  5:10 PM  Result Value Ref Range Status   Specimen Description BLOOD BLOOD LEFT FOREARM  Final   Special Requests IN PEDIATRIC BOTTLE [place]  Final   Culture   Final    NO GROWTH < 24  HOURS Performed at [facility]    Report Status PENDING  Incomplete  Urine culture     Status: None   Collection Time: 04/15/16  5:12 PM  Result Value Ref Range Status   Specimen Description URINE, CLEAN CATCH  Final   Special Requests NONE  Final   Culture NO GROWTH Performed at [facility]   Final   Report Status 04/16/2016 FINAL  Final  Culture, blood (Routine X 2) w Reflex to ID Panel  Status: None (Preliminary result)   Collection Time: 04/15/16  8:53 PM  Result Value Ref Range Status   Specimen Description BLOOD BLOOD LEFT FOREARM  Final   Special Requests BOTTLES DRAWN AEROBIC AND ANAEROBIC 5CC  Final   Culture   Final    NO GROWTH < 24 HOURS Performed at [facility]    Report Status PENDING  Incomplete      Radiology Studies: Dg Chest 2 View Result Date: 04/15/2016 No radiographic evidence of acute cardiopulmonary disease. Signed, Dulcy Fanny. Earleen Newport, DO [facility] Radiology Electronically Signed   By: Corrie Mckusick D.O.   On: 04/15/2016 16:55   Ct Angio Chest Pe W And/or Wo Contrast Result Date: 04/15/2016 No acute pulmonary embolism on this technically limited examination. Bronchial wall thickening with lower lobe bronchi mucoid impaction associated can be seen with bronchitis or reactive airway disease. Electronically Signed   By: Elon Alas M.D.   On: 04/15/2016 18:48      Scheduled Meds: . azithromycin  250 mg Oral Daily  . enoxaparin (LOVENOX) injection  0.5 mg/kg Subcutaneous QHS  . guaiFENesin  600 mg Oral BID  . sodium chloride flush  3 mL Intravenous Q12H   Continuous Infusions:    LOS: 0 days    Time spent: 25 minutes  Greater than 50% of the time spent on counseling and coordinating the care.   Leisa Lenz, MD [facility] Pager [phone]  If 7PM-7AM, please contact night-coverage [email or web address] Password [email or web address] 04/17/2016, 11:17 AM

## 2016-04-17 NOTE — Discharge Instructions (Addendum)

## 2016-04-18 LAB — HEPATITIS C ANTIBODY (REFLEX)

## 2016-04-18 LAB — HCV COMMENT:

## 2016-04-18 LAB — HIV-1/2 AB - DIFFERENTIATION
HIV 1 Ab: NEGATIVE
HIV 2 Ab: NEGATIVE

## 2016-04-18 LAB — HEPATITIS B SURFACE ANTIGEN: Hepatitis B Surface Ag: NEGATIVE

## 2016-04-18 LAB — RNA QUALITATIVE

## 2016-04-18 LAB — HEPATITIS B SURFACE ANTIBODY, QUANTITATIVE: Hepatitis B-Post: <3.1 m[IU]/mL — ABNORMAL LOW (ref >9.9)

## 2016-04-20 LAB — CULTURE, BLOOD (ROUTINE X 2): CULTURE: NO GROWTH

## 2016-04-21 LAB — CULTURE, BLOOD (ROUTINE X 2): Culture: NO GROWTH

## 2016-04-21 LAB — IGE: IGE (IMMUNOGLOBULIN E), SERUM: 409 [IU]/mL — AB (ref 0–100)

## 2016-05-22 ENCOUNTER — Encounter (HOSPITAL_COMMUNITY): Payer: Self-pay | Admitting: Emergency Medicine

## 2016-05-22 ENCOUNTER — Emergency Department (HOSPITAL_COMMUNITY)
Admission: EM | Admit: 2016-05-22 | Discharge: 2016-05-22 | Disposition: A | Discharge status: Home / Self Care | Payer: Self-pay | Attending: Emergency Medicine | Admitting: Emergency Medicine

## 2016-05-22 DIAGNOSIS — M62830 Muscle spasm of back: Secondary | ICD-10-CM | POA: Insufficient documentation

## 2016-05-22 DIAGNOSIS — Z79899 Other long term (current) drug therapy: Secondary | ICD-10-CM | POA: Insufficient documentation

## 2016-05-22 DIAGNOSIS — F17200 Nicotine dependence, unspecified, uncomplicated: Secondary | ICD-10-CM | POA: Insufficient documentation

## 2016-05-22 MED ORDER — METHOCARBAMOL 500 MG PO TABS
1000.0000 mg | ORAL_TABLET | Freq: Once | ORAL | Status: AC
  Administered 2016-05-22: 1000 mg via ORAL
  Filled 2016-05-22: qty 2

## 2016-05-22 MED ORDER — METHOCARBAMOL 500 MG PO TABS: 500.0000 mg | ORAL_TABLET | Freq: Three times a day (TID) | ORAL | 0 refills | Status: DC | PRN

## 2016-05-22 NOTE — ED Notes (Signed)
ED Provider at bedside. 

## 2016-05-22 NOTE — ED Triage Notes (Signed)
Pt complaint of intermittent right upper back pain for 3 days. Pt denies injury or anything that makes it better or worse. Pt recently hospitalized for "black mold."

## 2016-05-22 NOTE — ED Provider Notes (Signed)
[place] DEPT Provider Note   CSN: [id number] Arrival date & time: 05/22/16  7564     History   Chief Complaint Chief Complaint  Patient presents with  . Back Pain    HPI Jared Hunt is a 30 y.o. male. Complaint of back pain.  HPI:  Patient has awakened last 2 days with some pain between his shoulder blade in his right side of his back. States that it feels like a muscle spasm. He is quite concerned it may be related to his BuSpar. He has elaborate story of missing a dose of BuSpar 2 days and is afraid that his concern. [place] quite anxious. He has no pleuritic discomfort. He is not short of breath. He appears otherwise well.  Feels well.  History reviewed. No pertinent past medical history.  Patient Active Problem List   Diagnosis Date Noted  . 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 Tattoo   . Former smoker   . Wheezing   . Acute bronchitis   . Lactic acidosis   . Onychomycosis   . Sepsis ([place]) 04/15/2016  . Hypokalemia 04/15/2016    History reviewed. No pertinent surgical history.     Home Medications    Prior to Admission medications   Medication Sig Start Date End Date Taking? Authorizing Provider  azithromycin (ZITHROMAX) 250 MG tablet Take 1 tablet (250 mg total) by mouth daily. 04/17/16   Robbie Lis, MD  Creatine Monohydrate POWD Take by mouth See admin instructions. Take 1 gram daily.    Historical Provider, MD  Garlic 3329 MG CAPS Take 2,000 mg by mouth daily.    Historical Provider, MD  guaiFENesin (MUCINEX) 600 MG 12 hr tablet Take 600 mg by mouth 2 (two) times daily as needed for cough or to loosen phlegm.    Historical Provider, MD  L-Methionine POWD Take by mouth daily. As directed.    Historical Provider, MD  MAGNESIUM PO Take 1 tablet by mouth daily as needed (magnesium supplementation.).    Historical Provider, MD  methocarbamol (ROBAXIN) 500 MG tablet Take 1 tablet (500 mg total) by mouth 3 (three) times daily between meals as needed.  05/22/16   Tanna Furry, MD  Misc Natural Products (TURMERIC CURCUMIN) CAPS Take 1 capsule by mouth daily. 1000 mg.    Historical Provider, MD  MISC NATURAL PRODUCTS PO Take 1 capsule by mouth daily. "SerraGold."  Contains per product label: 18 mg calcium citrate , magnesium citrate 0.1 mg, 80,000 HUT protease, and 100,000 SPU serrapeptase.    Historical Provider, MD    Family History Family History  Problem Relation Age of Onset  . Osteoarthritis Mother   . Asthma Neg Hx   . Allergic Disorder Neg Hx     Social History Social History  Substance Use Topics  . Smoking status: Current Every Day Smoker    Packs/day: 0.50  . Smokeless tobacco: Never Used  . Alcohol use No     Allergies   Sulfa antibiotics   Review of Systems Review of Systems  Constitutional: Negative for appetite change, chills, diaphoresis, fatigue and fever.  HENT: Negative for mouth sores, sore throat and trouble swallowing.   Eyes: Negative for visual disturbance.  Respiratory: Negative for cough, chest tightness, shortness of breath and wheezing.   Cardiovascular: Negative for chest pain.  Gastrointestinal: Negative for abdominal distention, abdominal pain, diarrhea, nausea and vomiting.  Endocrine: Negative for polydipsia, polyphagia and polyuria.  Genitourinary: Negative for dysuria, frequency and hematuria.  Musculoskeletal: Positive  for back pain. Negative for gait problem.  Skin: Negative for color change, pallor and rash.  Neurological: Negative for dizziness, syncope, light-headedness and headaches.  Hematological: Does not bruise/bleed easily.  Psychiatric/Behavioral: Negative for behavioral problems and confusion.     Physical Exam Updated Vital Signs BP 117/75 (BP Location: Left Arm)   Pulse 97   Temp 97.6 F (36.4 C) (Oral)   Resp 16   Wt 215 lb (97.5 kg)   SpO2 100%   BMI 30.85 kg/m   Physical Exam  Constitutional: He is oriented to person, place, and time. He appears  well-developed and well-nourished. No distress.  HENT:  Head: Normocephalic.  Eyes: Conjunctivae are normal. Pupils are equal, round, and reactive to light. No scleral icterus.  Neck: Normal range of motion. Neck supple. No thyromegaly present.  Cardiovascular: Normal rate and regular rhythm.  Exam reveals no gallop and no friction rub.   No murmur heard. Pulmonary/Chest: Effort normal and breath sounds normal. No respiratory distress. He has no wheezes. He has no rales.  Abdominal: Soft. Bowel sounds are normal. He exhibits no distension. There is no tenderness. There is no rebound.  Musculoskeletal: Normal range of motion.  Neurological: He is alert and oriented to person, place, and time.  Skin: Skin is warm and dry. No rash noted.  Psychiatric: He has a normal mood and affect. His behavior is normal.     ED Treatments / Results  Labs (all labs ordered are listed, but only abnormal results are displayed) Labs Reviewed - No data to display  EKG  EKG Interpretation None       Radiology No results found.  Procedures Procedures (including critical care time)  Medications Ordered in ED Medications  methocarbamol (ROBAXIN) tablet 1,000 mg (not administered)     Initial Impression / Assessment and Plan / ED Course  I have reviewed the triage vital signs and the nursing notes.  Pertinent labs & imaging results that were available during my care of the patient were reviewed by me and considered in my medical decision making (see chart for details).  Clinical Course     Clear lungs. Not reproducible. Not feeling symptoms now. Patient reassured.  Final Clinical Impressions(s) / ED Diagnoses   Final diagnoses:  Muscle spasm of back    New Prescriptions New Prescriptions   METHOCARBAMOL (ROBAXIN) 500 MG TABLET    Take 1 tablet (500 mg total) by mouth 3 (three) times daily between meals as needed.     Tanna Furry, MD 05/22/16 [phone]

## 2016-06-19 ENCOUNTER — Ambulatory Visit: Payer: Self-pay | Admitting: Internal Medicine

## 2016-07-27 ENCOUNTER — Ambulatory Visit (INDEPENDENT_AMBULATORY_CARE_PROVIDER_SITE_OTHER): Payer: Self-pay | Admitting: Internal Medicine

## 2016-07-27 ENCOUNTER — Encounter: Payer: Self-pay | Admitting: Internal Medicine

## 2016-07-27 VITALS — BP 100/70 | HR 88 | Resp 12 | Ht 69.25 in | Wt 223.0 lb

## 2016-07-27 DIAGNOSIS — F329 Major depressive disorder, single episode, unspecified: Secondary | ICD-10-CM | POA: Insufficient documentation

## 2016-07-27 DIAGNOSIS — Z77120 Contact with and (suspected) exposure to mold (toxic): Secondary | ICD-10-CM

## 2016-07-27 DIAGNOSIS — F419 Anxiety disorder, unspecified: Secondary | ICD-10-CM

## 2016-07-27 DIAGNOSIS — F32A Depression, unspecified: Secondary | ICD-10-CM | POA: Insufficient documentation

## 2016-07-27 DIAGNOSIS — J3089 Other allergic rhinitis: Secondary | ICD-10-CM

## 2016-07-27 DIAGNOSIS — F410 Panic disorder [episodic paroxysmal anxiety] without agoraphobia: Secondary | ICD-10-CM

## 2016-07-27 NOTE — Progress Notes (Signed)
Subjective:    Patient ID: Jared Hunt, male    DOB: 1985-11-10, 31 y.o.   MRN: [id number]  HPI   Here to establish  Hospitalized with presumed sepsis presumed secondary to respiratory infection.  Nothing grew from blood or urine cultures.  Apparently, no sputum culture able to be obtained.   Gives a history in living in an apartment starting in June of 2017 until the hospitalization 03/2016 where there was an apparent obvious indoor fungal/mold issue. He started having respiratory issues including sinus congestion, pain, chest congestion with cough of productive gray to yellow sputum. Avoided staying there much and living more so with his girlfriend in August and noted whenever he went to his apartment, he would develop respiratory symptoms. Has not been back to the apartment since hospitalization in November except to remove belongings, most of which he threw out.  Does not feel sinus or chest congestion currently.   He was also a smoker, quitting about 2 months ago after gradually weaning form cigarettes.  Previously smoked about 1ppd.  History of smoking MJ as a teen, but nothing since then. Has not spoke with [facility] or Legal Aid about this, but is concerned someone else will end up renting this apartment without remediation.    His main concern is anxiety and panic attacks that seem to have come to a head since the hospitalization.  His sudden severe illness combined with being told he had a positive rapid HIV screening test, which was negated with confirmatory testing he feels were factors in this onset of anxiety. Has been on all types of anti depressants when a teenager and did not do well with any of them.  Was hospitalized as a ?31 yo when he had difficulties with being biracial and seeing how people of color are treated.  Was hitting himself in the face. On [place]. Day, went to a doctors office and prescribed Buspar and Mirtazepine. Had reaction he states was almost like having a  seizure.  Weaned himself off. Is having problems with sleep, which has had a problem with in the past.  Took Ambien in past and did things he did not recall.   Current Meds  Medication Sig  . Garlic 4097 MG CAPS Take 2,000 mg by mouth daily.  Marland Kitchen MAGNESIUM PO Take 1 tablet by mouth daily as needed (magnesium supplementation.).  Marland Kitchen methocarbamol (ROBAXIN) 500 MG tablet Take 1 tablet (500 mg total) by mouth 3 (three) times daily between meals as needed.  . Misc Natural Products (TURMERIC CURCUMIN) CAPS Take 1 capsule by mouth daily. 1000 mg.    Allergies  Allergen Reactions  . Sulfa Antibiotics Anaphylaxis   Past Medical History:  Diagnosis Date  . Depression age 2 - 31   History reviewed. No pertinent surgical history.   Family History  Problem Relation Age of Onset  . Osteoarthritis Mother   . Blindness Sister   . Hypothyroidism Brother   . Hypothyroidism Sister   . Asthma Neg Hx   . Allergic Disorder Neg Hx    Social History   Social History  . Marital status: Single    Spouse name: N/A  . Number of children: 0  . Years of education: some college   Occupational History  . waiter at [employer]    Social History Main Topics  . Smoking status: Former Smoker    Packs/day: 0.50    Years: 17.00  . Smokeless tobacco: Never Used  . Alcohol use Yes  Comment: twice weekly  . Drug use: Yes    Types: Marijuana     Comment: MJ in teens only  . Sexual activity: Yes   Other Topics Concern  . Not on file   Social History Narrative   Grew up in [place] in the [place]   Father, who is does not know well, lives here.   Has lived in [place] past 5 years.       Review of Systems     Objective:   Physical Exam  NAD HEENT:  PERRL, EOMI, conjunctivae without injection, TMs pearly gray, throat without injection, nasal mucosa on the left somewhat swollen and erythematous, right without abnormality. NT over frontal or maxillary sinuses. Neck: Supple, No  adenopathy Chest:  CTA with good air movement CV:  RRR with normal S1 and S2, No S3, S4 or murmur, radial and DP pulses normal and equal Abd:  S, NT, No HSM or mass, + BS         Assessment & Plan:  1.  Check on fermented products following significant exposure to black mold--call into [facility].  2.  Housing issues/financial costs of illness/remediation of apartment for safety of future renters:  [facility] referral.  Legal Aid referral.  3.  Significant mold exposure:  Currently without much in way of symptoms.  Follow for now.  4.  Anxiety and Panic Attacks:  Patient has not done well in past with medications.  Will refer to Macie Burows, LCSW for behavioral modification and counseling.

## 2016-07-27 NOTE — Patient Instructions (Addendum)
If you do not hear from [facility] or Legal Aid in the next week, call the clinic. Fexofenadine 180 mg daily as needed for nasal congestion, or if you feel you are wheezing.

## 2016-07-29 ENCOUNTER — Encounter: Payer: Self-pay | Admitting: Internal Medicine

## 2016-07-30 ENCOUNTER — Ambulatory Visit (INDEPENDENT_AMBULATORY_CARE_PROVIDER_SITE_OTHER): Payer: Self-pay | Admitting: Licensed Clinical Social Worker

## 2016-07-30 DIAGNOSIS — F419 Anxiety disorder, unspecified: Secondary | ICD-10-CM

## 2016-07-31 NOTE — Progress Notes (Signed)
   THERAPY PROGRESS NOTE  Session Time: 68mn  Participation Level: Active  Behavioral Response: CasualAlertEuthymic  Type of Therapy: Individual Therapy  Treatment Goals addressed: Coping  Interventions: Motivational Interviewing and Supportive  Summary: CNIRAJ KUDRNAis a 31y.o. male who presents with a positive mood and appropriate affect. He reported that he is seeking counseling at this time because of ongoing depression and anxiety that he has struggled with for most of his life. He shared that he does not have particularly close relationships with his sister and brother but that his relationship with his mother is very close. CHebershared that a recent health scare has also made him more aware of the need to take care of himself physically and mentally. He described living in an apartment with a serious mold problem, which led to a lung infection and then sepsis, in November 2017. He quit smoking after this scare. CLeeroyshared about his current relationship, which he reported as good in many ways; he expressed frustration that his girlfriend's health problems are preventing them from having sex and may cause the relationship to end. CAldanalso shared about a past relationship that he continues to grieve. He shared that his ex was immature and narcissistic but he is having a hard time getting over the relationship. CNylanshared that his goals for counseling include increasing clarity about his life and decreasing self-sabatoging behaviors.  Suicidal/Homicidal: Nowithout intent/plan  Therapist Response: LCSW utilized supportive counseling techniques throughout the session in order to validate emotions and encourage open expression of emotion. LCSW began the clinical assessment but was unable to finish due to time constraints. LCSW and CTrejuandiscussed goals for counseling sessions and identified the focus of treatment.  Plan: Return again in 2 weeks.  Diagnosis: Axis I: See current  hospital problem list    Axis II: No diagnosis    [name] LCSW 07/31/2016

## 2016-08-10 ENCOUNTER — Other Ambulatory Visit: Payer: Self-pay | Admitting: Licensed Clinical Social Worker

## 2016-08-19 ENCOUNTER — Other Ambulatory Visit: Payer: Self-pay | Admitting: Licensed Clinical Social Worker

## 2016-09-18 ENCOUNTER — Encounter: Payer: Self-pay | Admitting: Internal Medicine

## 2016-09-18 ENCOUNTER — Ambulatory Visit (INDEPENDENT_AMBULATORY_CARE_PROVIDER_SITE_OTHER): Payer: Self-pay | Admitting: Internal Medicine

## 2016-09-18 VITALS — BP 102/70 | HR 80 | Resp 12 | Ht 69.0 in | Wt 236.0 lb

## 2016-09-18 DIAGNOSIS — R0600 Dyspnea, unspecified: Secondary | ICD-10-CM

## 2016-09-18 DIAGNOSIS — J301 Allergic rhinitis due to pollen: Secondary | ICD-10-CM

## 2016-09-18 DIAGNOSIS — F419 Anxiety disorder, unspecified: Secondary | ICD-10-CM

## 2016-09-18 MED ORDER — FEXOFENADINE HCL 180 MG PO TABS: 180.0000 mg | ORAL_TABLET | Freq: Every day | ORAL | Status: DC

## 2016-09-18 MED ORDER — FLUTICASONE PROPIONATE 50 MCG/ACT NA SUSP: 2.0000 | Freq: Every day | NASAL | 11 refills | Status: DC

## 2016-09-18 NOTE — Progress Notes (Signed)
   Subjective:    Patient ID: Jared Hunt, male    DOB: [birth year], 31 y.o.   MRN: [id number]  HPI   2 weeks ago, started developing chest pain, difficulties taking a deep breath, lot of chest tightness.   He is very stressed, thinking of dying.   Has only been to Macie Burows, LCSW only once and has not rescheduled since he had a change with his work schedule.  No associated palpitations. Has felt flushed at times with this.  No cough, congestion.   Has not been smoking anything. Does not feel like he is congested.  Does have a lot of big trees around the house in which he lives.  Does have a Ventolin HFA, which does help for about 1 hour.  This was an old Rx from when he was discharged from the hospital.   States mother diagnosed with possible brain tumor 3-4 weeks ago.  Was found to what sounds like papilledema.   Current Meds  Medication Sig  . Misc Natural Products (TURMERIC CURCUMIN) CAPS Take 1 capsule by mouth daily. 1000 mg.  . Omega 3-6-9 Fatty Acids (OMEGA 3-6-9 COMPLEX PO) Take by mouth. 2 daily with food    Allergies  Allergen Reactions  . Sulfa Antibiotics Anaphylaxis         Review of Systems     Objective:   Physical Exam  NAD Nasal voice HEENT:  PERRL, EOMI, conjunctivae without injection, TMs pearly gray, throat without injection.  NT over frontal and maxillary sinuses.  Nasal mucosa swollen and boggy Neck:  Supple, No adenopathy Chest:  Good air movement, CTA CV:  RRR without murmur or rub, radial pulses normal and equal Abd:  S, NT, No HSM or masses, + BS        Assessment & Plan:  1.  Seasonal and possibly environmental allergies: To try Fexofenadine 180 mg daily or another otc 24 hour antihistamine.  Also Fluticasone nasal spray, 2 sprays each nostril daily.  2.  Anxiety:  To get started in working with Macie Burows, LCSW as has not tolerated medication in past.  3. Dyspnea:  No findings today with lower respiratory tract.  Not clear if his  dyspnea is related to anxiety or if having some bronchospasm triggered by allergies  To treat allergies and see if improves.

## 2016-09-24 ENCOUNTER — Ambulatory Visit (INDEPENDENT_AMBULATORY_CARE_PROVIDER_SITE_OTHER): Payer: Self-pay | Admitting: Licensed Clinical Social Worker

## 2016-09-24 DIAGNOSIS — F419 Anxiety disorder, unspecified: Secondary | ICD-10-CM

## 2016-09-24 NOTE — Progress Notes (Signed)
   THERAPY PROGRESS NOTE  Session Time: 19mn  Participation Level: Active  Behavioral Response: CasualAlertAnxious  Type of Therapy: Individual Therapy  Treatment Goals addressed: Coping  Interventions: Motivational Interviewing and Supportive  Summary: Jared DAVIDOWis a 31y.o. male who presents with a slightly anxious mood and appropriate affect. He reported that he has been struggling lately with frequent thoughts of death and feelings of "impending doom." He denied suicidal ideation. [name] about some of his current stressors, which include work, his relationship, and frustrations with living in a [place] He reported that his work as a wDoctor, general practiceis unfulfilling and poorly paid. He shared about his relationship, especially in terms of their poor sex life. He shared that the guilt from cheating on his girlfriend is eating away at him and that he has a lot of conflicting feelings regarding whether or not he should stay with her. He stated [place] second [place]. Jared Snelland LCSW processed about what factors could help him decide what would be the best decision in this situation. He shared about some of his ambitious dreams for the future, which include ideas for renewable, cheap energy that would allow people to "not be slaves anymore." He shared that he does a lot of negative thinking about himself and would like to work on that for the next session.  Suicidal/Homicidal: Nowithout intent/plan  Therapist Response: LCSW utilized supportive counseling techniques throughout the session in order to validate emotions and encourage open expression of emotion. LCSW and CKolsonprocessed about current stressors. LCSW encouraged [name] tackle one stressor at a time, probably starting with making a decision about his relationship.   Plan: Return again in 1 week.   [name] LCSW 09/24/2016

## 2016-10-01 ENCOUNTER — Ambulatory Visit (INDEPENDENT_AMBULATORY_CARE_PROVIDER_SITE_OTHER): Payer: Self-pay | Admitting: Licensed Clinical Social Worker

## 2016-10-01 DIAGNOSIS — F419 Anxiety disorder, unspecified: Secondary | ICD-10-CM

## 2016-10-02 NOTE — Progress Notes (Signed)
   THERAPY PROGRESS NOTE  Session Time: 65mn  Participation Level: Active  Behavioral Response: CasualAlertEuthymic  Type of Therapy: Individual Therapy  Treatment Goals addressed: Coping  Interventions: Strength-based and Supportive  Summary: CAXL RODINOis a 31y.o. male who presents with a euthymic mood and appropriate affect. He shared that he had recently had a break-through in understanding himself. He reported that he better understands why he is an empath and has such a hard time shutting out the pain and difficulty in the world; he stated that this is directly linked to the [place] 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 He shared that he works best under pressure, and feels confident that he will find something. COdusexpressed his desire [place] but also shared ambivalence. He shared his ongoing feelings of guilt about cheating on his girlfriend previously, as well as his guilt for having sexual desires that she cannot fulfill due to medical issues. He stated that he feels like a bad person constantly. CAneudyshared about how his stepfather treated him during childhood, which included neglect and rejection.   Suicidal/Homicidal: Nowithout intent/plan  Therapist Response: LCSW utilized supportive counseling techniques throughout the session in order to validate emotions and encourage open expression of emotion. LCSW and CKelliprocessed about his current stressors. LCSW encouraged CJakaito apply for the orange card as soon as possible.  Plan: Return again in 1 weeks.    [name] LCSW 10/02/2016

## 2016-10-08 ENCOUNTER — Ambulatory Visit (INDEPENDENT_AMBULATORY_CARE_PROVIDER_SITE_OTHER): Payer: Self-pay | Admitting: Licensed Clinical Social Worker

## 2016-10-08 DIAGNOSIS — F419 Anxiety disorder, unspecified: Secondary | ICD-10-CM

## 2016-10-15 ENCOUNTER — Other Ambulatory Visit: Payer: Self-pay | Admitting: Licensed Clinical Social Worker

## 2016-10-19 NOTE — Progress Notes (Signed)
   THERAPY PROGRESS NOTE  Session Time: 69mn  Participation Level: Active  Behavioral Response: CasualAlertAnxious  Type of Therapy: Individual Therapy  Treatment Goals addressed: Coping  Interventions: Strength-based and Supportive  Summary: Jared ENRIQUEis a 31y.o. male who presents with a slightly anxious mood and appropriate affect. He reported that he was not doing well because he was still in limbo about his relationship status. He shared that he got drunk and told his girlfriend that he was not sexually satisfied, as she has physical issues meaning she cannot have sex, and she was very upset with him. He shared feelings about realizing that he cannot have a sexless relationship but also severe guilt for wanting to end the relationship. CAbdulahshared logistical problems that will crop up at a break-up, including housing, due to his bad credit history.    Suicidal/Homicidal: Nowithout intent/plan  Therapist Response: LCSW utilized supportive counseling techniques throughout the session in order to validate emotions and encourage open expression of emotion. LCSW reflected on the feelings that CJesuahad shared and noted that it seemed like he had already made a decision. LCSW encouraged him to trust his instincts about what he needed to do. LCSW and Jared Snelldiscussed LCSW maternity leave -- CFlorentinorequested to see the temporary sEducation officer, museum  Plan: Return again in 2 weeks.   [name] LCSW 10/19/2016

## 2016-10-28 ENCOUNTER — Ambulatory Visit: Payer: Self-pay | Admitting: Internal Medicine

## 2016-10-29 ENCOUNTER — Telehealth: Payer: Self-pay | Admitting: Licensed Clinical Social Worker

## 2016-10-29 NOTE — Telephone Encounter (Signed)
LCSWA left a message regarding scheduling an appointment.

## 2016-11-02 ENCOUNTER — Telehealth: Payer: Self-pay | Admitting: Licensed Clinical Social Worker

## 2016-11-02 NOTE — Telephone Encounter (Signed)
SW lm regarding scheduling an appointment, second attempt.

## 2016-11-06 ENCOUNTER — Emergency Department (HOSPITAL_COMMUNITY): Payer: Self-pay

## 2016-11-06 ENCOUNTER — Encounter (HOSPITAL_COMMUNITY): Payer: Self-pay | Admitting: Emergency Medicine

## 2016-11-06 ENCOUNTER — Emergency Department (HOSPITAL_COMMUNITY)
Admission: EM | Admit: 2016-11-06 | Discharge: 2016-11-06 | Disposition: A | Discharge status: Home / Self Care | Payer: Self-pay | Attending: Emergency Medicine | Admitting: Emergency Medicine

## 2016-11-06 DIAGNOSIS — Z7982 Long term (current) use of aspirin: Secondary | ICD-10-CM | POA: Insufficient documentation

## 2016-11-06 DIAGNOSIS — Z79899 Other long term (current) drug therapy: Secondary | ICD-10-CM | POA: Insufficient documentation

## 2016-11-06 DIAGNOSIS — Z87891 Personal history of nicotine dependence: Secondary | ICD-10-CM | POA: Insufficient documentation

## 2016-11-06 DIAGNOSIS — R091 Pleurisy: Secondary | ICD-10-CM | POA: Insufficient documentation

## 2016-11-06 LAB — BASIC METABOLIC PANEL
Anion gap: 11 (ref 5–15)
BUN: 14 mg/dL (ref 6–20)
CALCIUM: 8.7 mg/dL — AB (ref 8.9–10.3)
CHLORIDE: 101 mmol/L (ref 101–111)
CO2: 24 mmol/L (ref 22–32)
CREATININE: 0.98 mg/dL (ref 0.61–1.24)
Glucose, Bld: 92 mg/dL (ref 65–99)
Potassium: 3.8 mmol/L (ref 3.5–5.1)
SODIUM: 136 mmol/L (ref 135–145)

## 2016-11-06 LAB — CBC
HCT: 44.8 % (ref 39.0–52.0)
Hemoglobin: 16.2 g/dL (ref 13.0–17.0)
MCH: 31 pg (ref 26.0–34.0)
MCHC: 36.2 g/dL — ABNORMAL HIGH (ref 30.0–36.0)
MCV: 85.8 fL (ref 78.0–100.0)
Platelets: 242 10*3/uL (ref 150–400)
RBC: 5.22 MIL/uL (ref 4.22–5.81)
RDW: 12.4 % (ref 11.5–15.5)
WBC: 9.1 10*3/uL (ref 4.0–10.5)

## 2016-11-06 MED ORDER — NAPROXEN 500 MG PO TABS: 500.0000 mg | ORAL_TABLET | Freq: Two times a day (BID) | ORAL | 0 refills | Status: DC

## 2016-11-06 NOTE — Discharge Instructions (Signed)
Take the medications as needed for pain, follow up with your primary care doctor on Monday as planned.  Return as needed for worsening symptoms

## 2016-11-06 NOTE — ED Triage Notes (Signed)
Pt c/o nonproductive cough, SOB, squeezing right side chest pressure, pain with inspiration x 4-5 days. Symptoms progressive throughout day. PCP diagnosed pt with allergies, started taking Claritin with some relief.  Hx hospitalization in November 2017 for lung infection, states lungs never felt fully recovered since.

## 2016-11-06 NOTE — ED Notes (Signed)
With RN assessment Tomi Bamberger arrives to bedside and verbalizes to hold protocol orders until provider assessment complete.

## 2016-11-06 NOTE — ED Provider Notes (Signed)
[facility] DEPT Provider Note   CSN: [id number] Arrival date & time: 11/06/16  0913     History   Chief Complaint Chief Complaint  Patient presents with  . Shortness of Breath  . Chest Pain    HPI Jared Hunt is a 31 y.o. male.  HPI Pt had a severe lung infection in November related to sepsis and prolonged mold exposure.   He has been following up with a primary care doctor.  He was last seen a couple of months ago because he was having trouble with cough and his chest hurting.  He was told it was related to allergies.  He took claritin and it improved.  A few days ago he started having trouble taking in deep breaths again.  He did not call his PCP because often it takes a while to get seen and he was concerned with his history of sepsis that he should not wait too long.   Past Medical History:  Diagnosis Date  . Depression age 89 - 52  . Sepsis ([place]) 03/2016   presumed sepsis following months of respiratory symptoms felt secondary to mold in living quarters    Patient Active Problem List   Diagnosis Date Noted  . Depression   . 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 Tattoo   . Former smoker   . Wheezing   . Acute bronchitis   . Lactic acidosis   . Onychomycosis   . Sepsis ([place]) 04/15/2016  . Hypokalemia 04/15/2016    History reviewed. No pertinent surgical history.     Home Medications    Prior to Admission medications   Medication Sig Start Date End Date Taking? Authorizing Provider  albuterol (PROVENTIL HFA;VENTOLIN HFA) 108 (90 Base) MCG/ACT inhaler Inhale 1-2 puffs into the lungs every 6 (six) hours as needed for wheezing or shortness of breath.   Yes [provider]  Ascorbic Acid (VITAMIN C PO) Take 1 tablet by mouth daily.   Yes [provider]  aspirin 81 MG chewable tablet Chew 81-162 mg by mouth every 6 (six) hours as needed for mild pain, moderate pain, fever or headache.   Yes [provider]  fexofenadine (ALLEGRA) 180  MG tablet Take 1 tablet (180 mg total) by mouth daily. Patient taking differently: Take 180 mg by mouth daily as needed for allergies.  09/18/16  Yes Mack Hook, MD  Flaxseed, Linseed, (FLAXSEED OIL PO) Take 2 tablets by mouth daily.   Yes [provider]  OVER THE COUNTER MEDICATION Take 1 capsule by mouth daily. *CBD Oil Capsule* takes for anxiety   Yes [provider]  fluticasone (FLONASE) 50 MCG/ACT nasal spray Place 2 sprays into both nostrils daily. Patient not taking: Reported on 11/06/2016 09/18/16   Mack Hook, MD  methocarbamol (ROBAXIN) 500 MG tablet Take 1 tablet (500 mg total) by mouth 3 (three) times daily between meals as needed. Patient not taking: Reported on 09/18/2016 05/22/16   Tanna Furry, MD  naproxen (NAPROSYN) 500 MG tablet Take 1 tablet (500 mg total) by mouth 2 (two) times daily. 11/06/16   Dorie Rank, MD    Family History Family History  Problem Relation Age of Onset  . Osteoarthritis Mother   . Blindness Sister   . Hypothyroidism Brother   . Hypothyroidism Sister   . Asthma Neg Hx   . Allergic Disorder Neg Hx     Social History Social History  Substance Use Topics  . Smoking status: Former Smoker  Packs/day: 0.50    Years: 17.00  . Smokeless tobacco: Never Used  . Alcohol use Yes     Comment: twice weekly     Allergies   Sulfa antibiotics   Review of Systems Review of Systems  All other systems reviewed and are negative.    Physical Exam Updated Vital Signs BP 125/78 (BP Location: Left Arm)   Pulse 93   Temp 98.6 F (37 C) (Oral)   Resp 18   SpO2 100%   Physical Exam  Constitutional: He appears well-developed and well-nourished. No distress.  HENT:  Head: Normocephalic and atraumatic.  Right Ear: External ear normal.  Left Ear: External ear normal.  Eyes: Conjunctivae are normal. Right eye exhibits no discharge. Left eye exhibits no discharge. No scleral icterus.  Neck: Neck supple. No tracheal  deviation present.  Cardiovascular: Normal rate, regular rhythm and intact distal pulses.   Pulmonary/Chest: Effort normal and breath sounds normal. No stridor. No respiratory distress. He has no wheezes. He has no rales.  Abdominal: Soft. Bowel sounds are normal. He exhibits no distension. There is no tenderness. There is no rebound and no guarding.  Musculoskeletal: He exhibits no edema or tenderness.  Neurological: He is alert. He has normal strength. No cranial nerve deficit (no facial droop, extraocular movements intact, no slurred speech) or sensory deficit. He exhibits normal muscle tone. He displays no seizure activity. Coordination normal.  Skin: Skin is warm and dry. No rash noted.  Psychiatric: He has a normal mood and affect.  Nursing note and vitals reviewed.    ED Treatments / Results  Labs (all labs ordered are listed, but only abnormal results are displayed) Labs Reviewed  BASIC METABOLIC PANEL - Abnormal; Notable for the following:       Result Value   Calcium 8.7 (*)    All other components within normal limits  CBC - Abnormal; Notable for the following:    MCHC 36.2 (*)    All other components within normal limits    EKG  EKG Interpretation  Date/Time:  Friday November 06 2016 09:23:03 EDT Ventricular Rate:  96 PR Interval:    QRS Duration: 89 QT Interval:  342 QTC Calculation: 433 R Axis:   82 Text Interpretation:  Sinus rhythm Confirmed by Tanna Furry [phone]) on 11/06/2016 9:43:38 AM Also confirmed by Tanna Furry [phone]), editor Drema Pry [phone])  on 11/06/2016 10:36:14 AM       Radiology Dg Chest 2 View  Result Date: 11/06/2016 CLINICAL DATA:  Chest pain for 1 week EXAM: CHEST  2 VIEW COMPARISON:  Chest radiograph April 15, 2016 and chest CT April 15, 2016. FINDINGS: Lungs are clear. Heart size and pulmonary vascularity are normal. No adenopathy. No pneumothorax. No bone lesions. IMPRESSION: No edema or consolidation. Electronically Signed    By: Lowella Grip III M.D.   On: 11/06/2016 10:14    Procedures Procedures (including critical care time)  Medications Ordered in ED Medications - No data to display   Initial Impression / Assessment and Plan / ED Course  I have reviewed the triage vital signs and the nursing notes.  Pertinent labs & imaging results that were available during my care of the patient were reviewed by me and considered in my medical decision making (see chart for details).   patient presented to the emergency room for mild pleuritic chest discomfort. Patient has a history of being admitted to the hospital for sepsis related to a fungal lung infection in the past.  She was concerned that he was having recurrent symptoms associated with that. His chest x-ray does not show pneumonia. He does not have any sepsis physiology. His laboratory tests are reassuring.  Patient has no history of PE or DVT. Previously he had a CT angio  that was negative when he had similar symptoms with his prior presentation.  Low risk for PE.  Perc negative.  Doubt PE.  Suspect viral pleurisy.  Final Clinical Impressions(s) / ED Diagnoses   Final diagnoses:  Pleurisy    New Prescriptions New Prescriptions   NAPROXEN (NAPROSYN) 500 MG TABLET    Take 1 tablet (500 mg total) by mouth 2 (two) times daily.     Dorie Rank, MD 11/06/16 1201

## 2016-11-09 ENCOUNTER — Other Ambulatory Visit: Payer: Self-pay | Admitting: Licensed Clinical Social Worker

## 2016-11-09 ENCOUNTER — Ambulatory Visit: Payer: Self-pay | Admitting: Internal Medicine

## 2016-11-12 ENCOUNTER — Telehealth: Payer: Self-pay | Admitting: Licensed Clinical Social Worker

## 2016-11-12 NOTE — Telephone Encounter (Signed)
LCSWA left a message in regards to missed therapy appointment and to find out if Chun would like to reschedule an appointment.

## 2017-01-26 ENCOUNTER — Other Ambulatory Visit: Payer: Self-pay | Admitting: Licensed Clinical Social Worker

## 2017-04-09 ENCOUNTER — Encounter (HOSPITAL_COMMUNITY): Payer: Self-pay | Admitting: Emergency Medicine

## 2017-04-09 ENCOUNTER — Emergency Department (HOSPITAL_BASED_OUTPATIENT_CLINIC_OR_DEPARTMENT_OTHER)
Admission: EM | Admit: 2017-04-09 | Discharge: 2017-04-09 | Disposition: A | Discharge status: Home / Self Care | Payer: Self-pay | Source: Home / Self Care | Attending: Emergency Medicine | Admitting: Emergency Medicine

## 2017-04-09 ENCOUNTER — Emergency Department (HOSPITAL_COMMUNITY)
Admission: EM | Admit: 2017-04-09 | Discharge: 2017-04-09 | Disposition: A | Discharge status: Home / Self Care | Payer: Self-pay | Attending: Emergency Medicine | Admitting: Emergency Medicine

## 2017-04-09 ENCOUNTER — Emergency Department (HOSPITAL_COMMUNITY): Payer: Self-pay

## 2017-04-09 DIAGNOSIS — R2242 Localized swelling, mass and lump, left lower limb: Secondary | ICD-10-CM | POA: Insufficient documentation

## 2017-04-09 DIAGNOSIS — M7989 Other specified soft tissue disorders: Secondary | ICD-10-CM

## 2017-04-09 DIAGNOSIS — R0602 Shortness of breath: Secondary | ICD-10-CM | POA: Insufficient documentation

## 2017-04-09 DIAGNOSIS — M79609 Pain in unspecified limb: Secondary | ICD-10-CM

## 2017-04-09 DIAGNOSIS — Z87891 Personal history of nicotine dependence: Secondary | ICD-10-CM | POA: Insufficient documentation

## 2017-04-09 LAB — COMPREHENSIVE METABOLIC PANEL
ALBUMIN: 4.3 g/dL (ref 3.5–5.0)
ALK PHOS: 47 U/L (ref 38–126)
ALT: 44 U/L (ref 17–63)
ANION GAP: 7 (ref 5–15)
AST: 35 U/L (ref 15–41)
BUN: 13 mg/dL (ref 6–20)
CO2: 27 mmol/L (ref 22–32)
CREATININE: 1.09 mg/dL (ref 0.61–1.24)
Calcium: 9.2 mg/dL (ref 8.9–10.3)
Chloride: 101 mmol/L (ref 101–111)
GFR calc Af Amer: >60 mL/min (ref >60)
GFR calc non Af Amer: >60 mL/min (ref >60)
GLUCOSE: 137 mg/dL — AB (ref 65–99)
Potassium: 4 mmol/L (ref 3.5–5.1)
SODIUM: 135 mmol/L (ref 135–145)
Total Bilirubin: 0.6 mg/dL (ref 0.3–1.2)
Total Protein: 7.6 g/dL (ref 6.5–8.1)

## 2017-04-09 LAB — CBC WITH DIFFERENTIAL/PLATELET
Basophils Absolute: 0 10*3/uL (ref 0.0–0.1)
Basophils Relative: 0 %
EOS PCT: 4 %
Eosinophils Absolute: 0.3 10*3/uL (ref 0.0–0.7)
HEMATOCRIT: 45.2 % (ref 39.0–52.0)
HEMOGLOBIN: 16.3 g/dL (ref 13.0–17.0)
LYMPHS ABS: 3.3 10*3/uL (ref 0.7–4.0)
Lymphocytes Relative: 42 %
MCH: 30.8 pg (ref 26.0–34.0)
MCHC: 36.1 g/dL — ABNORMAL HIGH (ref 30.0–36.0)
MCV: 85.4 fL (ref 78.0–100.0)
Monocytes Absolute: 0.8 10*3/uL (ref 0.1–1.0)
Monocytes Relative: 10 %
NEUTROS ABS: 3.6 10*3/uL (ref 1.7–7.7)
Neutrophils Relative %: 44 %
Platelets: 263 10*3/uL (ref 150–400)
RBC: 5.29 MIL/uL (ref 4.22–5.81)
RDW: 12 % (ref 11.5–15.5)
WBC: 8 10*3/uL (ref 4.0–10.5)

## 2017-04-09 LAB — I-STAT TROPONIN, ED: Troponin i, poc: 0.01 ng/mL (ref 0.00–0.08)

## 2017-04-09 LAB — BRAIN NATRIURETIC PEPTIDE: B Natriuretic Peptide: 11.9 pg/mL (ref 0.0–100.0)

## 2017-04-09 MED ORDER — IOPAMIDOL (ISOVUE-370) INJECTION 76%
100.0000 mL | Freq: Once | INTRAVENOUS | Status: AC | PRN
  Administered 2017-04-09: 100 mL via INTRAVENOUS

## 2017-04-09 MED ORDER — IOPAMIDOL (ISOVUE-370) INJECTION 76%
INTRAVENOUS | Status: AC
  Administered 2017-04-09: 11:00:00
  Filled 2017-04-09: qty 100

## 2017-04-09 NOTE — Discharge Instructions (Signed)
You were seen in the ED today with difficulty breathing and leg swelling. We did not find evidence of blood clots and your CT scan was normal. Try to limit total salt intake to 3g per day and follow up with your PCP in the coming week.   Return to the ED with any new chest pain, difficulty breathing, fever, or chills.

## 2017-04-09 NOTE — Progress Notes (Signed)
Left lower extremity venous duplex completed. No evidence of DVT,superficial thrombosis, or Baker's cyst.  Toma Copier, RVS  04/09/2017 9:45 AM

## 2017-04-09 NOTE — ED Triage Notes (Signed)
Pt reports he has been having swelling in his L leg for the past week. Pt feels like the swelling in his L leg has gone down, but now is having swelling in his abd. Pt feels like the swelling in his abd makes him feel SOB.

## 2017-04-09 NOTE — ED Provider Notes (Signed)
Emergency Department Provider Note   I have reviewed the triage vital signs and the nursing notes.   HISTORY  Chief Complaint Leg Swelling and abd swelling   HPI Jared Hunt is a 31 y.o. male with PMH of depression presents to the emergency department for evaluation of left leg swelling over the past several days which seems to be improving but the patient has developed some difficulty breathing and abdominal distention.  Patient states that he noticed his left leg was swelling compared to the right.  No history of similar presentation.  No redness or warmth to the leg.  Patient denies pain in the leg. No injury.  The patient states that his left leg swelling seems to be improving but he feels that he has a more distended abdomen and feels some shortness of breath.  He is having shortness of breath both at rest and with exertion.  He denies any pain in his abdomen or chest but states that he feels a movement sensation there sometimes.  No history of blood clots in the legs or lungs.  No fevers or chills.  No productive cough.  Patient continues to have bowel movements and denies any vomiting.    Past Medical History:  Diagnosis Date  . Depression age 35 - 32  . Sepsis ([place]) 03/2016   presumed sepsis following months of respiratory symptoms felt secondary to mold in living quarters    Patient Active Problem List   Diagnosis Date Noted  . Depression   . 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 Tattoo   . Former smoker   . Wheezing   . Acute bronchitis   . Lactic acidosis   . Onychomycosis   . Sepsis ([place]) 04/15/2016  . Hypokalemia 04/15/2016    History reviewed. No pertinent surgical history.  Current Outpatient Rx  . Order #: [id number] Class: Historical Med  . Order #: [id number] Class: Historical Med  . Order #: [id number] Class: Historical Med  . Order #: [id number] Class: Historical Med  . Order #: [id number] Class: OTC  . Order #: [id number] Class: Historical Med  . Order #:  [id number] Class: No Print  . Order #: [id number] Class: Print  . Order #: [id number] Class: Print    Allergies Sulfa antibiotics  Family History  Problem Relation Age of Onset  . Osteoarthritis Mother   . Blindness Sister   . Hypothyroidism Brother   . Hypothyroidism Sister   . Asthma Neg Hx   . Allergic Disorder Neg Hx     Social History Social History   Tobacco Use  . Smoking status: Former Smoker    Packs/day: 0.50    Years: 17.00    Pack years: 8.50  . Smokeless tobacco: Never Used  Substance Use Topics  . Alcohol use: Yes    Comment: twice weekly  . Drug use: Yes    Types: Marijuana    Comment: MJ in teens only    Review of Systems  Constitutional: No fever/chills Eyes: No visual changes. ENT: No sore throat. Cardiovascular: Denies chest pain. Respiratory: Positive shortness of breath. Gastrointestinal: No abdominal pain.  No nausea, no vomiting.  No diarrhea.  No constipation. Positive abdominal distension.  Genitourinary: Negative for dysuria. Musculoskeletal: Negative for back pain. Positive left leg swelling (resolving).  Skin: Negative for rash. Neurological: Negative for headaches, focal weakness or numbness.  10-point ROS otherwise negative.  ____________________________________________   PHYSICAL EXAM:  VITAL SIGNS: ED Triage Vitals [04/09/17 0823]  Enc Vitals Group     BP (!) 155/94  Pulse Rate 86     Resp 13     Temp 97.7 F (36.5 C)     Temp src      SpO2 100 %    Constitutional: Alert and oriented. Well appearing and in no acute distress. Eyes: Conjunctivae are normal. Head: Atraumatic. Nose: No congestion/rhinnorhea. Mouth/Throat: Mucous membranes are moist.  Neck: No stridor. Cardiovascular: Normal rate, regular rhythm. Good peripheral circulation. Grossly normal heart sounds. No pitting edema. No rales.  Respiratory: Normal respiratory effort.  No retractions. Lungs CTAB. Gastrointestinal: Soft and nontender. Mild  distention.  Musculoskeletal: No lower extremity tenderness or erythema. Left leg slightly more swollen than the right. No gross deformities of extremities. Neurologic:  Normal speech and language. No gross focal neurologic deficits are appreciated.  Skin:  Skin is warm, dry and intact. No rash noted.  ____________________________________________   LABS (all labs ordered are listed, but only abnormal results are displayed)  Labs Reviewed  COMPREHENSIVE METABOLIC PANEL - Abnormal; Notable for the following components:      Result Value   Glucose, Bld 137 (*)    All other components within normal limits  CBC WITH DIFFERENTIAL/PLATELET - Abnormal; Notable for the following components:   MCHC 36.1 (*)    All other components within normal limits  BRAIN NATRIURETIC PEPTIDE  I-STAT TROPONIN, ED   ____________________________________________  EKG   EKG Interpretation  Date/Time:  Friday April 09 2017 08:26:29 EST Ventricular Rate:  99 PR Interval:    QRS Duration: 87 QT Interval:  338 QTC Calculation: 434 R Axis:   82 Text Interpretation:  Sinus rhythm Baseline wander in lead(s) V1 V2 No STEMI. Similar to June 2018 tracing.  Confirmed by Nanda Quinton [phone]) on 04/09/2017 8:28:48 AM       ____________________________________________  RADIOLOGY  Dg Chest 2 View  Result Date: 04/09/2017 CLINICAL DATA:  Shortness of breath for 3 days. EXAM: CHEST  2 VIEW COMPARISON:  11/06/2016 FINDINGS: Heart and mediastinal contours are within normal limits. No focal opacities or effusions. No acute bony abnormality. IMPRESSION: No active cardiopulmonary disease. Electronically Signed   By: Rolm Baptise M.D.   On: 04/09/2017 10:04   Ct Angio Chest Pe W And/or Wo Contrast  Result Date: 04/09/2017 CLINICAL DATA:  Swelling of the lower extremities over the last few weeks. Development of shortness of breath. EXAM: CT ANGIOGRAPHY CHEST WITH CONTRAST TECHNIQUE: Multidetector CT imaging of the  chest was performed using the standard protocol during bolus administration of intravenous contrast. Multiplanar CT image reconstructions and MIPs were obtained to evaluate the vascular anatomy. CONTRAST:  100 cc Isovue 370 COMPARISON:  Chest radiography same day.  CT 04/15/2016. FINDINGS: Cardiovascular: Pulmonary arterial opacification is good. There are no pulmonary emboli. The aorta is normal. Heart size is normal. No pericardial fluid. No coronary artery calcification. Mediastinum/Nodes: No mass or lymphadenopathy. Lungs/Pleura: The lungs are clear.  No effusions.  No focal lesions. Upper Abdomen: Normal Musculoskeletal: Normal Review of the MIP images confirms the above findings. IMPRESSION: Normal CT angiography the chest. No pulmonary embolism or other definable chest pathology. Electronically Signed   By: Nelson Chimes M.D.   On: 04/09/2017 11:40   DVT US:   Left lower extremity venous duplex completed. No evidence of DVT,superficial thrombosis, or Baker's cyst.  Toma Copier, RVS           04/09/2017 9:45 AM   ____________________________________________   PROCEDURES  Procedure(s) performed:   Procedures  None ____________________________________________   INITIAL IMPRESSION /  ASSESSMENT AND PLAN / ED COURSE  Pertinent labs & imaging results that were available during my care of the patient were reviewed by me and considered in my medical decision making (see chart for details).  Patient presents to the emergency department for evaluation of resolving left leg swelling but resulting shortness of breath with abdominal distention.  The abdomen is mildly distended with no tenderness to palpation.  No evidence to suggest small bowel obstruction.  He denies any chest pain but is having some shortness of breath both at rest and with exertion.  No signs of volume overload on exam.  I have some suspicion for PE given the left leg swelling that is resolved and now developed  shortness of breath.  Plan to start with ultrasound of the left lower extremity, labs, chest x-ray and reassess with low threshold for CT Angio of the chest.    LE [place] negative. CTA negative for PE. Patient discharged with plan to adhere to low salt diet and f/u with PCP.   At this time, I do not feel there is any life-threatening condition present. I have reviewed and discussed all results (EKG, imaging, lab, urine as appropriate), exam findings with patient. I have reviewed nursing notes and appropriate previous records.  I feel the patient is safe to be discharged home without further emergent workup. Discussed usual and customary return precautions. Patient and family (if present) verbalize understanding and are comfortable with this plan.  Patient will follow-up with their primary care provider. If they do not have a primary care provider, information for follow-up has been provided to them. All questions have been answered.  ____________________________________________  FINAL CLINICAL IMPRESSION(S) / ED DIAGNOSES  Final diagnoses:  Leg swelling  Shortness of breath     MEDICATIONS GIVEN DURING THIS VISIT:  Medications  iopamidol (ISOVUE-370) 76 % injection 100 mL (100 mLs Intravenous Contrast Given 04/09/17 1104)  iopamidol (ISOVUE-370) 76 % injection (  Contrast Given 04/09/17 1116)    Note:  This document was prepared using Dragon voice recognition software and may include unintentional dictation errors.  Nanda Quinton, MD Emergency Medicine    Long, [facility], MD 04/09/17 [phone]

## 2017-05-21 ENCOUNTER — Encounter (HOSPITAL_COMMUNITY): Payer: Self-pay | Admitting: Nurse Practitioner

## 2017-05-21 ENCOUNTER — Emergency Department (HOSPITAL_COMMUNITY)
Admission: EM | Admit: 2017-05-21 | Discharge: 2017-05-21 | Disposition: A | Discharge status: Home / Self Care | Payer: Self-pay | Attending: Emergency Medicine | Admitting: Emergency Medicine

## 2017-05-21 ENCOUNTER — Emergency Department (HOSPITAL_COMMUNITY): Payer: Self-pay

## 2017-05-21 DIAGNOSIS — R55 Syncope and collapse: Secondary | ICD-10-CM | POA: Insufficient documentation

## 2017-05-21 DIAGNOSIS — R064 Hyperventilation: Secondary | ICD-10-CM | POA: Insufficient documentation

## 2017-05-21 DIAGNOSIS — R202 Paresthesia of skin: Secondary | ICD-10-CM | POA: Insufficient documentation

## 2017-05-21 DIAGNOSIS — Z87891 Personal history of nicotine dependence: Secondary | ICD-10-CM | POA: Insufficient documentation

## 2017-05-21 DIAGNOSIS — Z79899 Other long term (current) drug therapy: Secondary | ICD-10-CM | POA: Insufficient documentation

## 2017-05-21 DIAGNOSIS — R6889 Other general symptoms and signs: Secondary | ICD-10-CM

## 2017-05-21 LAB — BASIC METABOLIC PANEL
ANION GAP: 12 (ref 5–15)
BUN: 12 mg/dL (ref 6–20)
CALCIUM: 9.5 mg/dL (ref 8.9–10.3)
CHLORIDE: 103 mmol/L (ref 101–111)
CO2: 20 mmol/L — AB (ref 22–32)
CREATININE: 0.99 mg/dL (ref 0.61–1.24)
GFR calc non Af Amer: >60 mL/min (ref >60)
Glucose, Bld: 130 mg/dL — ABNORMAL HIGH (ref 65–99)
Potassium: 3.5 mmol/L (ref 3.5–5.1)
SODIUM: 135 mmol/L (ref 135–145)

## 2017-05-21 LAB — CBC
HCT: 46.1 % (ref 39.0–52.0)
HEMOGLOBIN: 16.6 g/dL (ref 13.0–17.0)
MCH: 30.2 pg (ref 26.0–34.0)
MCHC: 36 g/dL (ref 30.0–36.0)
MCV: 83.8 fL (ref 78.0–100.0)
PLATELETS: 284 10*3/uL (ref 150–400)
RBC: 5.5 MIL/uL (ref 4.22–5.81)
RDW: 12.1 % (ref 11.5–15.5)
WBC: 7.7 10*3/uL (ref 4.0–10.5)

## 2017-05-21 LAB — I-STAT TROPONIN, ED: Troponin i, poc: 0 ng/mL (ref 0.00–0.08)

## 2017-05-21 LAB — CBG MONITORING, ED: GLUCOSE-CAPILLARY: 123 mg/dL — AB (ref 65–99)

## 2017-05-21 NOTE — ED Triage Notes (Signed)
Pt presents hyperventilating, mildly flushed in skin appearance and with c/o not feeling well. He states he is dizzy, feeling light headed and isn't breathing very well. He narrates a similar incidence that was his last visit that ended up his hospitalization. States symptoms were gradual with onset yesterday but worsened today while he was driving.Denies CP, N/V/D/C.

## 2017-05-21 NOTE — Discharge Instructions (Signed)
Please make sure you stay well hydrated.  You should be urinating multiple times a day and it should be light yellow or clear.  It is important to eat a balanced diet.  Please make sure you are practicing good sleep hygiene and self care.   If you develop chest pain, pass out, or have any other concerns you may always return to the emergency room for repeat evaluation.

## 2017-05-21 NOTE — ED Notes (Signed)
One set of blood cultures obtained in triage per triage nurse.

## 2017-05-21 NOTE — ED Provider Notes (Signed)
[facility] DEPT Provider Note   CSN: [id number] Arrival date & time: 05/21/17  1455     History   Chief Complaint Chief Complaint  Patient presents with  . Dizziness  . Not Feeling Well    HPI Jared Hunt is a 31 y.o. male who presents today for evaluation of shortness of breath, not feeling well and tingling in his arms and legs.  He reports that for the past week he has been feeling like he has "brain fog" and feeling mentally slow.  He reports that last November he was admitted for sepsis and this initially felt similar to that.  He reports that he was out running errands for his mother and started feeling like he could not get enough air in.  He reports feeling like his heart was beating really fast.  This progressed to feeling like his hands were "tight" and developing tingling in his arms or legs.  He denies any recent trauma.  He reports a history of anxiety and panic attacks since his sepsis admission.  He reports a few loose stools over the past week but denies true diarrhea, N/V, abdominal pain or chest pain.  He denies any leg swelling.  He denies any urinary symptoms.  No recent cough, wheezing, nasal congestion or sore throat.  He reports that currently all his symptoms have resolved and he only has slight feelings of brain fog.   HPI  Past Medical History:  Diagnosis Date  . Depression age 31 - 37  . Sepsis ([facility]) 03/2016   presumed sepsis following months of respiratory symptoms felt secondary to mold in living quarters    Patient Active Problem List   Diagnosis Date Noted  . Depression   . 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 Tattoo   . Former smoker   . Wheezing   . Acute bronchitis   . Lactic acidosis   . Onychomycosis   . Sepsis ([place]) 04/15/2016  . Hypokalemia 04/15/2016    History reviewed. No pertinent surgical history.     Home Medications    Prior to Admission medications   Medication Sig Start Date End Date  Taking? Authorizing Provider  albuterol (PROVENTIL HFA;VENTOLIN HFA) 108 (90 Base) MCG/ACT inhaler Inhale 1-2 puffs into the lungs every 6 (six) hours as needed for wheezing or shortness of breath.    [provider]  Ascorbic Acid (VITAMIN C PO) Take 1 tablet by mouth daily.    [provider]  fexofenadine (ALLEGRA) 180 MG tablet Take 1 tablet (180 mg total) by mouth daily. Patient not taking: Reported on 04/09/2017 09/18/16   Mack Hook, MD  Flaxseed, Linseed, (FLAXSEED OIL PO) Take 2 tablets by mouth daily.    [provider]  fluticasone (FLONASE) 50 MCG/ACT nasal spray Place 2 sprays into both nostrils daily. Patient not taking: Reported on 04/09/2017 09/18/16   Mack Hook, MD  ibuprofen (ADVIL,MOTRIN) 200 MG tablet Take 200-400 mg 2 (two) times daily as needed by mouth for moderate pain.    [provider]  methocarbamol (ROBAXIN) 500 MG tablet Take 1 tablet (500 mg total) by mouth 3 (three) times daily between meals as needed. Patient not taking: Reported on 04/09/2017 05/22/16   Tanna Furry, MD  naproxen (NAPROSYN) 500 MG tablet Take 1 tablet (500 mg total) by mouth 2 (two) times daily. Patient not taking: Reported on 04/09/2017 11/06/16   Dorie Rank, MD  OVER THE COUNTER MEDICATION Take 1 capsule daily by mouth. *  CBD Oil Capsule* takes for anxiety COMBO FLAXSEED, BORAGE OIL, FISH OIL    [provider]    Family History Family History  Problem Relation Age of Onset  . Osteoarthritis Mother   . Blindness Sister   . Hypothyroidism Brother   . Hypothyroidism Sister   . Asthma Neg Hx   . Allergic Disorder Neg Hx     Social History Social History   Tobacco Use  . Smoking status: Former Smoker    Packs/day: 0.50    Years: 17.00    Pack years: 8.50  . Smokeless tobacco: Never Used  Substance Use Topics  . Alcohol use: Yes    Comment: twice weekly  . Drug use: Yes    Types: Marijuana    Comment: MJ in teens only       Allergies   Sulfa antibiotics   Review of Systems Review of Systems  Constitutional: Negative for chills and fever.  HENT: Negative for ear pain and sore throat.   Eyes: Negative for pain and visual disturbance.  Respiratory: Positive for shortness of breath. Negative for cough.   Cardiovascular: Negative for chest pain and palpitations.       Heart feeling like beating fast.   Gastrointestinal: Negative for abdominal pain, diarrhea (Loose stools. ), nausea and vomiting.  Genitourinary: Negative for dysuria and hematuria.  Musculoskeletal: Negative for arthralgias and back pain.  Skin: Negative for color change and rash.  Neurological: Negative for seizures and syncope.       "Brain fog", tingling in all extremities.   Psychiatric/Behavioral: The patient is nervous/anxious.   All other systems reviewed and are negative.    Physical Exam Updated Vital Signs BP 123/66 (BP Location: Left Arm)   Pulse 84   Temp 98.5 F (36.9 C) (Oral)   Resp 18   SpO2 100%   Physical Exam  Constitutional: He is oriented to person, place, and time. He appears well-developed and well-nourished.  HENT:  Head: Normocephalic and atraumatic.  Mouth/Throat: Oropharynx is clear and moist.  Eyes: Conjunctivae and EOM are normal. Pupils are equal, round, and reactive to light. No scleral icterus.  Neck: Normal range of motion. Neck supple.  Cardiovascular: Normal rate, regular rhythm, normal heart sounds and intact distal pulses.  No murmur heard. Pulmonary/Chest: Effort normal and breath sounds normal. No respiratory distress.  Abdominal: Soft. Bowel sounds are normal. He exhibits no distension. There is no tenderness. There is no guarding.  Musculoskeletal: He exhibits no edema.  Neurological: He is alert and oriented to person, place, and time.  Mental Status:  Alert, oriented, thought content appropriate, able to give a coherent history. Speech fluent without evidence of aphasia. Able to  follow 2 step commands without difficulty.  Cranial Nerves:  II:  Peripheral visual fields grossly normal, pupils equal, round, reactive to light III,IV, VI: ptosis not present, extra-ocular motions intact bilaterally  V,VII: smile symmetric, facial light touch sensation equal VIII: hearing grossly normal to voice  X: uvula elevates symmetrically  XI: bilateral shoulder shrug symmetric and strong XII: midline tongue extension without fassiculations Motor:  Normal tone. 5/5 in upper and lower extremities bilaterally including strong and equal grip strength and dorsiflexion/plantar flexion Gait: normal gait and balance CV: distal pulses palpable throughout    Skin: Skin is warm and dry.  Psychiatric: He has a normal mood and affect. His behavior is normal.  Nursing note and vitals reviewed.    ED Treatments / Results  Labs (all labs ordered are  listed, but only abnormal results are displayed) Labs Reviewed  BASIC METABOLIC PANEL - Abnormal; Notable for the following components:      Result Value   CO2 20 (*)    Glucose, Bld 130 (*)    All other components within normal limits  CBG MONITORING, ED - Abnormal; Notable for the following components:   Glucose-Capillary 123 (*)    All other components within normal limits  CBC  URINALYSIS, ROUTINE W REFLEX MICROSCOPIC  I-STAT TROPONIN, ED    EKG  EKG Interpretation  Date/Time:  Friday May 21 2017 18:34:34 EST Ventricular Rate:  95 PR Interval:    QRS Duration: 95 QT Interval:  356 QTC Calculation: 448 R Axis:   85 Text Interpretation:  Sinus rhythm Probable left atrial enlargement Since last tracing rate slower t wave changes resolved Confirmed by Dorie Rank [phone]) on 05/21/2017 6:41:14 PM       Radiology Dg Chest 2 View  Result Date: 05/21/2017 CLINICAL DATA:  Lightheadedness for 1 week EXAM: CHEST  2 VIEW COMPARISON:  04/09/2017 FINDINGS: The heart size and mediastinal contours are within normal limits. Both  lungs are clear. The visualized skeletal structures are unremarkable. IMPRESSION: No active cardiopulmonary disease. Electronically Signed   By: Donavan Foil M.D.   On: 05/21/2017 16:04    Procedures Procedures (including critical care time)  Medications Ordered in ED Medications - No data to display   Initial Impression / Assessment and Plan / ED Course  I have reviewed the triage vital signs and the nursing notes.  Pertinent labs & imaging results that were available during my care of the patient were reviewed by me and considered in my medical decision making (see chart for details).  Clinical Course as of May 21 1856  Fri May 21, 2017  [place] New EKG obtained due to significant baseline wonder.   [EH]    Clinical Course User Index [EH] Lorin Glass, PA-C   Vedia Pereyra Klauer presents today for feeling short of breath, rapid heart rate, rapid breathing and feeling anxious.  At the time of evaluation all of his symptoms except for the feeling of "brain fog", which has been present for weeks, have fully resolved.  He did not have any chest pain, has a history of anxiety/panic attacks in the past and says this feels similar.  His labs, CXR, and EKG were reviewed, unremarkable.  At the time of my evaluation patient does not meet sepsis criteria, normal HR, RR, temp, white count.  I suspect that his symptoms are the result of a panic attack as they are fully resolved, in the setting of reassuring labs, EKG, and CXR.  PE considered, however patient is PERC negative and I would not expect his symptoms to resolve fully with out intervention.  He was given return precautions and stated his understanding.    Final Clinical Impressions(s) / ED Diagnoses   Final diagnoses:  Hyperventilation  Not feeling great  Near syncope    ED Discharge Orders    None       Ollen Gross 05/21/17 1901    Dorie Rank, MD 05/21/17 2329

## 2017-07-07 ENCOUNTER — Emergency Department (HOSPITAL_COMMUNITY): Payer: Self-pay

## 2017-07-07 ENCOUNTER — Emergency Department (HOSPITAL_BASED_OUTPATIENT_CLINIC_OR_DEPARTMENT_OTHER)
Admit: 2017-07-07 | Discharge: 2017-07-07 | Disposition: A | Discharge status: Home / Self Care | Payer: Self-pay | Attending: Emergency Medicine | Admitting: Emergency Medicine

## 2017-07-07 ENCOUNTER — Emergency Department (HOSPITAL_COMMUNITY)
Admission: EM | Admit: 2017-07-07 | Discharge: 2017-07-07 | Disposition: A | Discharge status: Home / Self Care | Payer: Self-pay | Attending: Emergency Medicine | Admitting: Emergency Medicine

## 2017-07-07 ENCOUNTER — Other Ambulatory Visit: Payer: Self-pay

## 2017-07-07 ENCOUNTER — Encounter (HOSPITAL_COMMUNITY): Payer: Self-pay | Admitting: Emergency Medicine

## 2017-07-07 DIAGNOSIS — M7989 Other specified soft tissue disorders: Secondary | ICD-10-CM

## 2017-07-07 DIAGNOSIS — Z87891 Personal history of nicotine dependence: Secondary | ICD-10-CM | POA: Insufficient documentation

## 2017-07-07 DIAGNOSIS — M79605 Pain in left leg: Secondary | ICD-10-CM | POA: Insufficient documentation

## 2017-07-07 DIAGNOSIS — R739 Hyperglycemia, unspecified: Secondary | ICD-10-CM

## 2017-07-07 LAB — BASIC METABOLIC PANEL
Anion gap: 9 (ref 5–15)
BUN: 16 mg/dL (ref 6–20)
CALCIUM: 9.1 mg/dL (ref 8.9–10.3)
CO2: 23 mmol/L (ref 22–32)
CREATININE: 0.91 mg/dL (ref 0.61–1.24)
Chloride: 104 mmol/L (ref 101–111)
GFR calc Af Amer: >60 mL/min (ref >60)
Glucose, Bld: 178 mg/dL — ABNORMAL HIGH (ref 65–99)
Potassium: 4.1 mmol/L (ref 3.5–5.1)
SODIUM: 136 mmol/L (ref 135–145)

## 2017-07-07 LAB — CBC WITH DIFFERENTIAL/PLATELET
BASOS ABS: 0 10*3/uL (ref 0.0–0.1)
Basophils Relative: 0 %
EOS ABS: 0.3 10*3/uL (ref 0.0–0.7)
EOS PCT: 4 %
HCT: 42 % (ref 39.0–52.0)
Hemoglobin: 14.9 g/dL (ref 13.0–17.0)
Lymphocytes Relative: 39 %
Lymphs Abs: 3 10*3/uL (ref 0.7–4.0)
MCH: 30.2 pg (ref 26.0–34.0)
MCHC: 35.5 g/dL (ref 30.0–36.0)
MCV: 85 fL (ref 78.0–100.0)
MONO ABS: 0.6 10*3/uL (ref 0.1–1.0)
Monocytes Relative: 8 %
Neutro Abs: 3.8 10*3/uL (ref 1.7–7.7)
Neutrophils Relative %: 49 %
PLATELETS: 250 10*3/uL (ref 150–400)
RBC: 4.94 MIL/uL (ref 4.22–5.81)
RDW: 12.7 % (ref 11.5–15.5)
WBC: 7.7 10*3/uL (ref 4.0–10.5)

## 2017-07-07 IMAGING — CR DG CHEST 2V
2 series · 2 of 2 positions shown · non-contrast
Comparison: Chest radiograph April 15, 2016 and chest CT
April 15, 2016.

CLINICAL DATA: Chest pain for 1 week

EXAM:
CHEST  2 VIEW

[w chest pa]
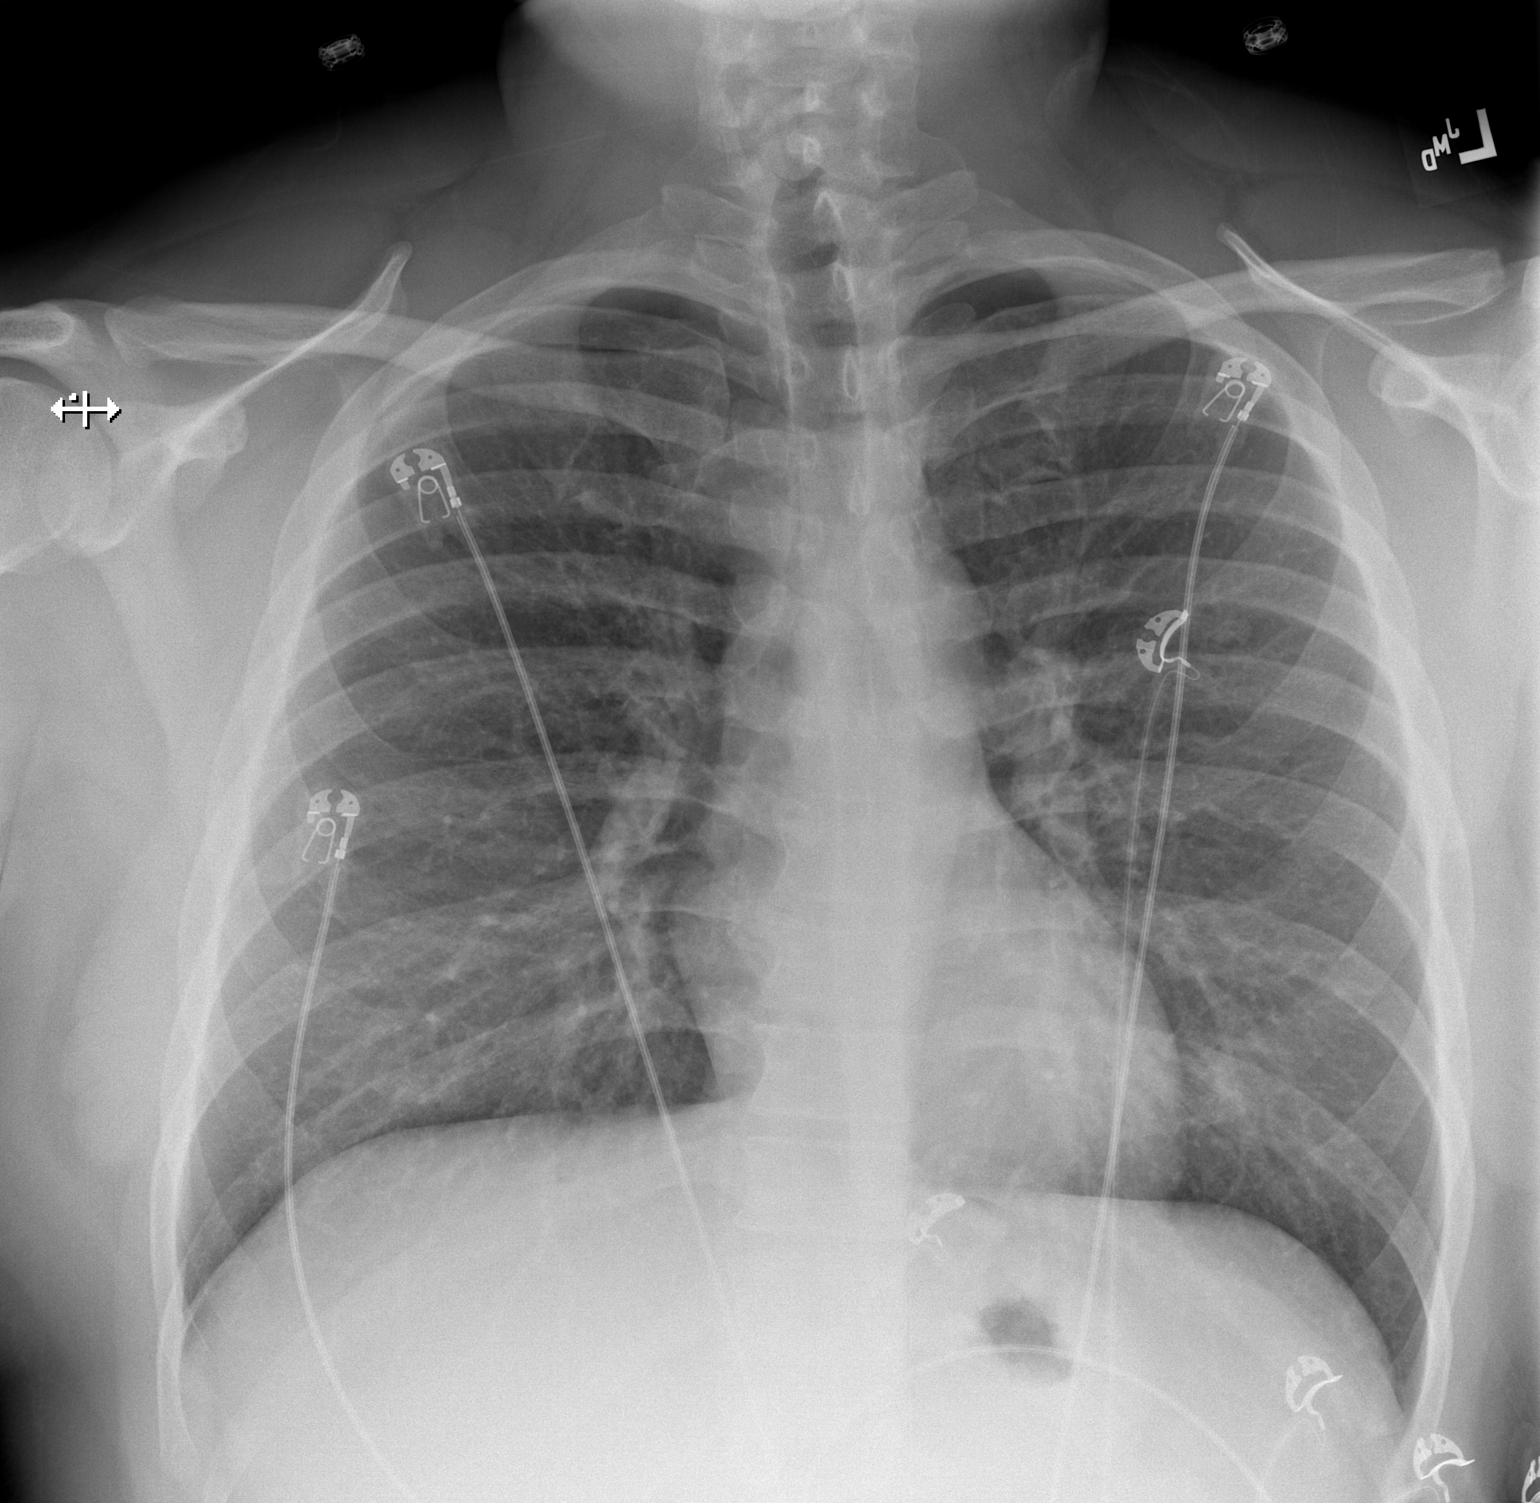

[w chest lat]
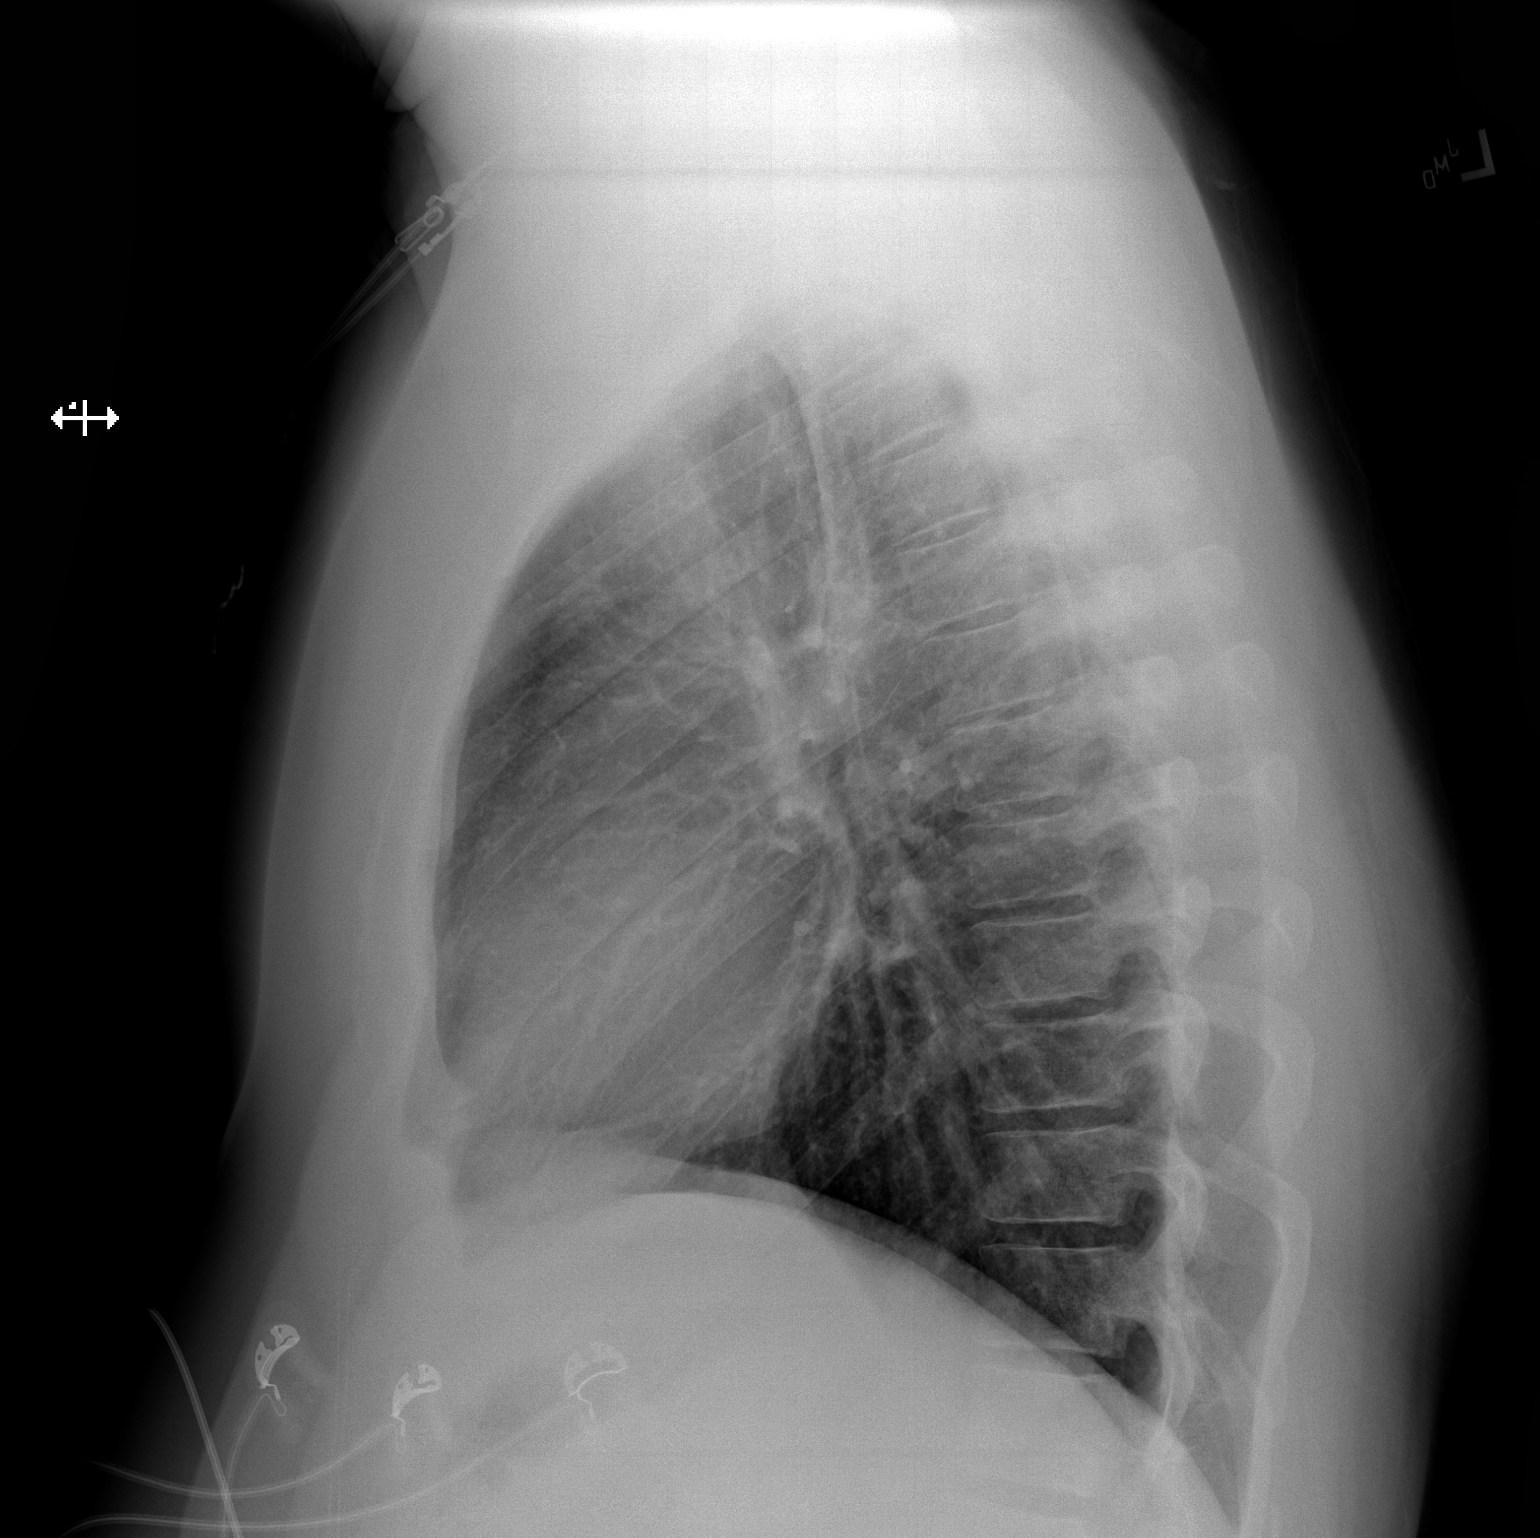

[2 of 2 positions shown; findings below may reference images not displayed]

FINDINGS: Lungs are clear. Heart size and pulmonary vascularity are normal. No
adenopathy. No pneumothorax. No bone lesions.
IMPRESSION: No edema or consolidation.

## 2017-07-07 NOTE — Progress Notes (Signed)
Left lower extremity venous duplex completed. No evidence of a DVT, superficial thrombosis, or Baker's cyst. Jared Hunt 07/07/2017,. 1:27 PM

## 2017-07-07 NOTE — ED Triage Notes (Signed)
Patient c/o lower leg pain that has been going on for week and reports going up leg to posterior knee. reports had girlfriend who died of DVT so wearing compression stocking and taking ASA. Denies swelling or redness. reports only painful when walking up stairs and with palpation.

## 2017-07-07 NOTE — ED Provider Notes (Signed)
[facility] DEPT Provider Note   CSN: [id number] Arrival date & time: 07/07/17  1012     History   Chief Complaint Chief Complaint  Patient presents with  . Leg Pain    HPI Jared Hunt is a 32 y.o. male.  Pt presents to the ED today with left leg pain that has been going on for about 1 week.  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  He is worried he has a DVT.  He had a girlfriend who died from a DVT.  No swelling.  No sob.  He did have a "flutter" in his chest a few days ago.  No cp now.      Past Medical History:  Diagnosis Date  . Depression age 42 - 30  . Sepsis ([place]) 03/2016   presumed sepsis following months of respiratory symptoms felt secondary to mold in living quarters    Patient Active Problem List   Diagnosis Date Noted  . Depression   . 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 Tattoo   . Former smoker   . Wheezing   . Acute bronchitis   . Lactic acidosis   . Onychomycosis   . Sepsis ([place]) 04/15/2016  . Hypokalemia 04/15/2016    History reviewed. No pertinent surgical history.     Home Medications    Prior to Admission medications   Medication Sig Start Date End Date Taking? Authorizing Provider  acetaminophen (TYLENOL) 500 MG tablet Take 1,000 mg by mouth 3 (three) times daily as needed for headache.   Yes [provider]  Ascorbic Acid (VITAMIN C PO) Take 1 tablet by mouth daily.   Yes [provider]  aspirin 81 MG tablet Take 324-486 mg by mouth daily as needed (DVT CONCERNS).   Yes [provider]  BEE POLLEN PO Take by mouth.   Yes [provider]  Flaxseed, Linseed, (FLAXSEED OIL PO) Take 2 tablets by mouth daily.   Yes [provider]  OIL OF OREGANO PO Take by mouth.   Yes [provider]  OVER THE COUNTER MEDICATION Take 1 capsule daily by mouth. *CBD Oil Capsule* takes for anxiety COMBO FLAXSEED, BORAGE OIL, FISH OIL   Yes [provider]  OVER THE COUNTER MEDICATION SHILAJIT-TRACE MINERALS WITH FULVIC ACID, KELP, BORON   Yes [provider]  albuterol (PROVENTIL HFA;VENTOLIN HFA) 108 (90 Base) MCG/ACT inhaler Inhale 1-2 puffs into the lungs every 6 (six) hours as needed for wheezing or shortness of breath.    [provider]  fexofenadine (ALLEGRA) 180 MG tablet Take 1 tablet (180 mg total) by mouth daily. Patient not taking: Reported on 04/09/2017 09/18/16   Mack Hook, MD  fluticasone [facility]) 50 MCG/ACT nasal spray Place 2 sprays into both nostrils daily. Patient not taking: Reported on 04/09/2017 09/18/16   Mack Hook, MD  methocarbamol (ROBAXIN) 500 MG tablet Take 1 tablet (500 mg total) by mouth 3 (three) times daily between meals as needed. Patient not taking: Reported on 04/09/2017 05/22/16   Tanna Furry, MD  naproxen (NAPROSYN) 500 MG tablet Take 1 tablet (500 mg total) by mouth 2 (two) times daily. Patient not taking: Reported on 04/09/2017 11/06/16   Dorie Rank, MD    Family History Family History  Problem Relation Age of Onset  . Osteoarthritis Mother   . Blindness Sister   . Hypothyroidism Brother   . Hypothyroidism Sister   . Asthma Neg  Hx   . Allergic Disorder Neg Hx     Social History Social History   Tobacco Use  . Smoking status: Former Smoker    Packs/day: 0.50    Years: 17.00    Pack years: 8.50  . Smokeless tobacco: Never Used  Substance Use Topics  . Alcohol use: Yes    Comment: twice weekly  . Drug use: Yes    Types: Marijuana    Comment: MJ in teens only     Allergies   Sulfa antibiotics   Review of Systems Review of Systems  Musculoskeletal:       Left leg pain  All other systems reviewed and are negative.    Physical Exam Updated Vital Signs BP (!) 152/94 (BP Location: Right Arm)   Pulse 94   Temp 98.2 F (36.8 C) (Oral)   Resp 18   Ht 5' 10"  (1.778 m)   Wt 108.9 kg (240 lb)   SpO2 99%   BMI 34.44 kg/m    Physical Exam  Constitutional: He is oriented to person, place, and time. He appears well-developed and well-nourished.  HENT:  Head: Normocephalic and atraumatic.  Right Ear: External ear normal.  Left Ear: External ear normal.  Nose: Nose normal.  Mouth/Throat: Oropharynx is clear and moist.  Eyes: Conjunctivae and EOM are normal. Pupils are equal, round, and reactive to light.  Neck: Normal range of motion. Neck supple.  Cardiovascular: Normal rate, regular rhythm, normal heart sounds and intact distal pulses.  Pulmonary/Chest: Effort normal and breath sounds normal.  Abdominal: Soft. Bowel sounds are normal.  Neurological: He is alert and oriented to person, place, and time.  Skin: Skin is warm. Capillary refill takes less than 2 seconds.  Psychiatric: He has a normal mood and affect. His behavior is normal. Judgment and thought content normal.  Nursing note and vitals reviewed.    ED Treatments / Results  Labs (all labs ordered are listed, but only abnormal results are displayed) Labs Reviewed  BASIC METABOLIC PANEL - Abnormal; Notable for the following components:      Result Value   Glucose, Bld 178 (*)    All other components within normal limits  CBC WITH DIFFERENTIAL/PLATELET    EKG  EKG Interpretation  Date/Time:  Wednesday July 07 2017 11:53:23 EST Ventricular Rate:  95 PR Interval:    QRS Duration: 89 QT Interval:  338 QTC Calculation: 425 R Axis:   70 Text Interpretation:  Sinus rhythm Baseline wander in lead(s) V1 Confirmed by Isla Pence [phone]) on 07/07/2017 12:15:41 PM       Radiology Dg Chest 2 View  Result Date: 07/07/2017 CLINICAL DATA:  Chest pain and cough. EXAM: CHEST  2 VIEW COMPARISON:  Chest x-ray dated May 21, 2017. FINDINGS: The heart size and mediastinal contours are within normal limits. Both lungs are clear. The visualized skeletal structures are unremarkable. IMPRESSION: No active cardiopulmonary disease. Electronically  Signed   By: Titus Dubin M.D.   On: 07/07/2017 12:31    Procedures Procedures (including critical care time)  Medications Ordered in ED Medications - No data to display   Initial Impression / Assessment and Plan / ED Course  I have reviewed the triage vital signs and the nursing notes.  Pertinent labs & imaging results that were available during my care of the patient were reviewed by me and considered in my medical decision making (see chart for details).    [place] LLE neg.  Pt is mildly hyperglycemic.  He  is encouraged to exercise and change his diet.  He knows to return if worse.  F/u with pcp.  Final Clinical Impressions(s) / ED Diagnoses   Final diagnoses:  Left leg pain  Hyperglycemia    ED Discharge Orders    None       Isla Pence, MD 07/07/17 1414

## 2017-07-07 NOTE — Discharge Instructions (Signed)
You need to get your blood sugar rechecked in about 1 month.  Decrease sugary drinks and snacks.

## 2017-09-23 ENCOUNTER — Emergency Department (HOSPITAL_COMMUNITY)
Admission: EM | Admit: 2017-09-23 | Discharge: 2017-09-23 | Disposition: A | Discharge status: Home / Self Care | Payer: Self-pay | Attending: Emergency Medicine | Admitting: Emergency Medicine

## 2017-09-23 ENCOUNTER — Emergency Department (HOSPITAL_COMMUNITY): Payer: Self-pay

## 2017-09-23 ENCOUNTER — Other Ambulatory Visit: Payer: Self-pay

## 2017-09-23 ENCOUNTER — Encounter (HOSPITAL_COMMUNITY): Payer: Self-pay | Admitting: Emergency Medicine

## 2017-09-23 DIAGNOSIS — Z87891 Personal history of nicotine dependence: Secondary | ICD-10-CM | POA: Insufficient documentation

## 2017-09-23 DIAGNOSIS — F419 Anxiety disorder, unspecified: Secondary | ICD-10-CM | POA: Insufficient documentation

## 2017-09-23 DIAGNOSIS — Z21 Asymptomatic human immunodeficiency virus [HIV] infection status: Secondary | ICD-10-CM | POA: Insufficient documentation

## 2017-09-23 DIAGNOSIS — Z7982 Long term (current) use of aspirin: Secondary | ICD-10-CM | POA: Insufficient documentation

## 2017-09-23 DIAGNOSIS — R0789 Other chest pain: Secondary | ICD-10-CM | POA: Insufficient documentation

## 2017-09-23 DIAGNOSIS — R079 Chest pain, unspecified: Secondary | ICD-10-CM

## 2017-09-23 LAB — BASIC METABOLIC PANEL
ANION GAP: 13 (ref 5–15)
BUN: 13 mg/dL (ref 6–20)
CALCIUM: 9.1 mg/dL (ref 8.9–10.3)
CO2: 21 mmol/L — ABNORMAL LOW (ref 22–32)
Chloride: 102 mmol/L (ref 101–111)
Creatinine, Ser: 0.93 mg/dL (ref 0.61–1.24)
GFR calc Af Amer: >60 mL/min (ref >60)
Glucose, Bld: 190 mg/dL — ABNORMAL HIGH (ref 65–99)
POTASSIUM: 3.5 mmol/L (ref 3.5–5.1)
SODIUM: 136 mmol/L (ref 135–145)

## 2017-09-23 LAB — CBC
HCT: 46.1 % (ref 39.0–52.0)
Hemoglobin: 16.5 g/dL (ref 13.0–17.0)
MCH: 30.4 pg (ref 26.0–34.0)
MCHC: 35.8 g/dL (ref 30.0–36.0)
MCV: 84.9 fL (ref 78.0–100.0)
Platelets: 291 10*3/uL (ref 150–400)
RBC: 5.43 MIL/uL (ref 4.22–5.81)
RDW: 12.4 % (ref 11.5–15.5)
WBC: 9.2 10*3/uL (ref 4.0–10.5)

## 2017-09-23 LAB — I-STAT TROPONIN, ED
TROPONIN I, POC: 0 ng/mL (ref 0.00–0.08)
TROPONIN I, POC: 0 ng/mL (ref 0.00–0.08)

## 2017-09-23 MED ORDER — LORAZEPAM 2 MG/ML IJ SOLN
0.5000 mg | Freq: Once | INTRAMUSCULAR | Status: AC
  Administered 2017-09-23: 0.5 mg via INTRAVENOUS
  Filled 2017-09-23: qty 1

## 2017-09-23 NOTE — ED Provider Notes (Signed)
[facility] DEPT Provider Note   CSN: [id number] Arrival date & time: 09/23/17  0909     History   Chief Complaint Chief Complaint  Patient presents with  . Chest Pain    HPI Jared Hunt is a 32 y.o. male.  32 year old male with prior history of anxiety and depression presents with complaint of chest pain.  Patient reports intermittent left-sided chest pain over the last week.  Patient reports that the pain occasionally is on the right chest wall as well.  Patient denies active chest pain.  He denies associated nausea, vomiting, diaphoresis, shortness of breath, or other complaint.  Patient admits to feeling more anxiety symptoms over the last week as well.  Patient currently does not take any medications for anxiety.  He is not taking any medications at home for his pain either.  The history is provided by the patient.  Chest Pain   This is a new problem. The current episode started more than 1 week ago. The problem occurs daily. The problem has not changed since onset.The pain is associated with movement. The pain is present in the lateral region. The patient is experiencing no pain. The quality of the pain is described as dull. The pain does not radiate. The symptoms are aggravated by certain positions. He has tried nothing for the symptoms. The treatment provided no relief.    Past Medical History:  Diagnosis Date  . Depression age 32 - 47  . Sepsis ([place]) 03/2016   presumed sepsis following months of respiratory symptoms felt secondary to mold in living quarters    Patient Active Problem List   Diagnosis Date Noted  . Depression   . 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 Tattoo   . Former smoker   . Wheezing   . Acute bronchitis   . Lactic acidosis   . Onychomycosis   . Sepsis ([place]) 04/15/2016  . Hypokalemia 04/15/2016    History reviewed. No pertinent surgical history.      Home Medications    Prior to Admission medications     Medication Sig Start Date End Date Taking? Authorizing Provider  albuterol (PROVENTIL HFA;VENTOLIN HFA) 108 (90 Base) MCG/ACT inhaler Inhale 1-2 puffs into the lungs every 6 (six) hours as needed for wheezing or shortness of breath.   Yes [provider]  aspirin 81 MG tablet Take 324-486 mg by mouth daily as needed (DVT CONCERNS).   Yes [provider]  Ascorbic Acid (VITAMIN C PO) Take 1 tablet by mouth daily.    [provider]  fexofenadine (ALLEGRA) 180 MG tablet Take 1 tablet (180 mg total) by mouth daily. Patient not taking: Reported on 04/09/2017 09/18/16   Mack Hook, MD  Flaxseed, Linseed, (FLAXSEED OIL PO) Take 2 tablets by mouth daily.    [provider]  fluticasone (FLONASE) 50 MCG/ACT nasal spray Place 2 sprays into both nostrils daily. Patient not taking: Reported on 04/09/2017 09/18/16   Mack Hook, MD  methocarbamol (ROBAXIN) 500 MG tablet Take 1 tablet (500 mg total) by mouth 3 (three) times daily between meals as needed. Patient not taking: Reported on 04/09/2017 05/22/16   Tanna Furry, MD  naproxen (NAPROSYN) 500 MG tablet Take 1 tablet (500 mg total) by mouth 2 (two) times daily. Patient not taking: Reported on 04/09/2017 11/06/16   Dorie Rank, MD  OIL OF OREGANO PO Take by mouth.    [provider]  OVER THE COUNTER MEDICATION Take 1 capsule daily  by mouth. *CBD Oil Capsule* takes for anxiety COMBO FLAXSEED, BORAGE OIL, FISH OIL    [provider]  OVER THE COUNTER MEDICATION SHILAJIT-TRACE MINERALS WITH FULVIC ACID, KELP, BORON    [provider]    Family History Family History  Problem Relation Age of Onset  . Osteoarthritis Mother   . Blindness Sister   . Hypothyroidism Brother   . Hypothyroidism Sister   . Asthma Neg Hx   . Allergic Disorder Neg Hx     Social History Social History   Tobacco Use  . Smoking status: Former Smoker    Packs/day: 0.50    Years: 17.00    Pack  years: 8.50  . Smokeless tobacco: Never Used  Substance Use Topics  . Alcohol use: Yes    Comment: twice weekly  . Drug use: Yes    Types: Marijuana    Comment: MJ in teens only     Allergies   Sulfa antibiotics   Review of Systems Review of Systems  Cardiovascular: Positive for chest pain.  All other systems reviewed and are negative.    Physical Exam Updated Vital Signs BP 113/60   Pulse 98   Temp 98.1 F (36.7 C) (Oral)   Resp 18   SpO2 97%   Physical Exam  Constitutional: He is oriented to person, place, and time. He appears well-developed and well-nourished. No distress.  HENT:  Head: Normocephalic and atraumatic.  Mouth/Throat: Oropharynx is clear and moist.  Eyes: Pupils are equal, round, and reactive to light. Conjunctivae and EOM are normal.  Neck: Normal range of motion. Neck supple.  Cardiovascular: Normal rate, regular rhythm and normal heart sounds.  Pulmonary/Chest: Effort normal and breath sounds normal. No respiratory distress.  Abdominal: Soft. He exhibits no distension. There is no tenderness.  Musculoskeletal: Normal range of motion. He exhibits no edema or deformity.  Neurological: He is alert and oriented to person, place, and time.  Skin: Skin is warm and dry.  Psychiatric: He has a normal mood and affect.  Nursing note and vitals reviewed.    ED Treatments / Results  Labs (all labs ordered are listed, but only abnormal results are displayed) Labs Reviewed  BASIC METABOLIC PANEL - Abnormal; Notable for the following components:      Result Value   CO2 21 (*)    Glucose, Bld 190 (*)    All other components within normal limits  CBC  ETHANOL  I-STAT TROPONIN, ED  I-STAT TROPONIN, ED    EKG EKG Interpretation  Date/Time:  Thursday Sep 23 2017 09:27:06 EDT Ventricular Rate:  138 PR Interval:    QRS Duration: 97 QT Interval:  283 QTC Calculation: 429 R Axis:   82 Text Interpretation:  Sinus tachycardia Ventricular premature  complex Aberrant complex Borderline repolarization abnormality Confirmed by Dene Gentry [phone]) on 09/23/2017 9:44:46 AM   Radiology Dg Chest 2 View  Result Date: 09/23/2017 CLINICAL DATA:  Chest pain EXAM: CHEST - 2 VIEW COMPARISON:  July 07, 2017 FINDINGS: Lungs are clear. The heart size and pulmonary vascularity are normal. No adenopathy. No pneumothorax. No bone lesions. IMPRESSION: No edema or consolidation. Electronically Signed   By: Lowella Grip III M.D.   On: 09/23/2017 10:34    Procedures Procedures (including critical care time)  Medications Ordered in ED Medications  LORazepam (ATIVAN) injection 0.5 mg (0.5 mg Intravenous Given 09/23/17 1008)     Initial Impression / Assessment and Plan / ED Course  I have reviewed the triage  vital signs and the nursing notes.  Pertinent labs & imaging results that were available during my care of the patient were reviewed by me and considered in my medical decision making (see chart for details).     MDM  Screen complete  Patient is presenting with atypical chest pain.  EKG is without acute findings.  Troponin x2 is negative.  Other screening labs are without significant abnormality.  Chest x-ray is normal.  Patient feels improved following his ED evaluation.  He understands the need for close follow-up.  Strict return precautions are given and understood.  Patient has established primary care and will follow up with same.  Final Clinical Impressions(s) / ED Diagnoses   Final diagnoses:  Nonspecific chest pain  Anxiety    ED Discharge Orders    None       Valarie Merino, MD 09/23/17 1315

## 2017-09-23 NOTE — ED Notes (Signed)
Post triage pt verbalizes took 325 mg of aspirin and lavender tea this am. Pt continues to verbalizes increased ETOH use over past few days "because of my birthday."

## 2017-09-23 NOTE — ED Triage Notes (Signed)
Pt complaint of intermittent dull chest pain onset yesterday. Verbalizes "in stress all the time;" pt verbalizes anxiety with triage. HR noted elevated with triage.

## 2017-11-17 ENCOUNTER — Encounter (HOSPITAL_COMMUNITY): Payer: Self-pay

## 2017-11-17 ENCOUNTER — Emergency Department (HOSPITAL_COMMUNITY)
Admission: EM | Admit: 2017-11-17 | Discharge: 2017-11-17 | Disposition: A | Discharge status: Home / Self Care | Payer: Self-pay | Attending: Emergency Medicine | Admitting: Emergency Medicine

## 2017-11-17 ENCOUNTER — Other Ambulatory Visit: Payer: Self-pay

## 2017-11-17 ENCOUNTER — Emergency Department (HOSPITAL_COMMUNITY): Payer: Self-pay

## 2017-11-17 DIAGNOSIS — R1084 Generalized abdominal pain: Secondary | ICD-10-CM | POA: Insufficient documentation

## 2017-11-17 DIAGNOSIS — R9389 Abnormal findings on diagnostic imaging of other specified body structures: Secondary | ICD-10-CM

## 2017-11-17 DIAGNOSIS — R109 Unspecified abdominal pain: Secondary | ICD-10-CM

## 2017-11-17 DIAGNOSIS — Z87891 Personal history of nicotine dependence: Secondary | ICD-10-CM | POA: Insufficient documentation

## 2017-11-17 LAB — COMPREHENSIVE METABOLIC PANEL
ALBUMIN: 4.3 g/dL (ref 3.5–5.0)
ALT: 56 U/L — ABNORMAL HIGH (ref 0–44)
ANION GAP: 9 (ref 5–15)
AST: 40 U/L (ref 15–41)
Alkaline Phosphatase: 63 U/L (ref 38–126)
BUN: 11 mg/dL (ref 6–20)
CHLORIDE: 103 mmol/L (ref 98–111)
CO2: 27 mmol/L (ref 22–32)
Calcium: 9.2 mg/dL (ref 8.9–10.3)
Creatinine, Ser: 0.89 mg/dL (ref 0.61–1.24)
GFR calc Af Amer: >60 mL/min (ref >60)
GFR calc non Af Amer: >60 mL/min (ref >60)
Glucose, Bld: 216 mg/dL — ABNORMAL HIGH (ref 70–99)
POTASSIUM: 4.2 mmol/L (ref 3.5–5.1)
SODIUM: 139 mmol/L (ref 135–145)
Total Bilirubin: 0.6 mg/dL (ref 0.3–1.2)
Total Protein: 7.8 g/dL (ref 6.5–8.1)

## 2017-11-17 LAB — LIPASE, BLOOD: LIPASE: 32 U/L (ref 11–51)

## 2017-11-17 LAB — URINALYSIS, ROUTINE W REFLEX MICROSCOPIC
Bilirubin Urine: NEGATIVE
GLUCOSE, UA: NEGATIVE mg/dL
HGB URINE DIPSTICK: NEGATIVE
Ketones, ur: NEGATIVE mg/dL
LEUKOCYTES UA: NEGATIVE
Nitrite: NEGATIVE
PH: 6 (ref 5.0–8.0)
Protein, ur: NEGATIVE mg/dL
SPECIFIC GRAVITY, URINE: 1.018 (ref 1.005–1.030)

## 2017-11-17 LAB — CBC
HEMATOCRIT: 44.9 % (ref 39.0–52.0)
HEMOGLOBIN: 16.2 g/dL (ref 13.0–17.0)
MCH: 30.3 pg (ref 26.0–34.0)
MCHC: 36.1 g/dL — ABNORMAL HIGH (ref 30.0–36.0)
MCV: 84.1 fL (ref 78.0–100.0)
Platelets: 292 10*3/uL (ref 150–400)
RBC: 5.34 MIL/uL (ref 4.22–5.81)
RDW: 12.2 % (ref 11.5–15.5)
WBC: 8.9 10*3/uL (ref 4.0–10.5)

## 2017-11-17 MED ORDER — SODIUM CHLORIDE 0.9 % IV BOLUS
1000.0000 mL | Freq: Once | INTRAVENOUS | Status: AC
  Administered 2017-11-17: 1000 mL via INTRAVENOUS

## 2017-11-17 MED ORDER — ONDANSETRON HCL 4 MG/2ML IJ SOLN
4.0000 mg | Freq: Once | INTRAMUSCULAR | Status: AC
  Administered 2017-11-17: 4 mg via INTRAVENOUS
  Filled 2017-11-17: qty 2

## 2017-11-17 MED ORDER — MORPHINE SULFATE (PF) 4 MG/ML IV SOLN
4.0000 mg | Freq: Once | INTRAVENOUS | Status: AC
  Administered 2017-11-17: 4 mg via INTRAVENOUS
  Filled 2017-11-17: qty 1

## 2017-11-17 NOTE — ED Provider Notes (Signed)
[facility] DEPT Provider Note   CSN: [id number] Arrival date & time: 11/17/17  1507     History   Chief Complaint Chief Complaint  Patient presents with  . Abdominal Pain  . Diarrhea  . Nausea    HPI Jared Hunt is a 32 y.o. male who presents emergency department today for abdominal pain on the right side.  Patient states that over the last 2 months she has been noticing increasing swelling of his abdomen.  She states for the last 2 weeks he has been having constant pain on the right mid/upper portion of his abdomen that radiates/wraps around to his right back.  He notes associated burping and belching as well as abdominal bloating.  He notes after eating he feels very nauseous but does not have any emesis.  He notes over the last several days he has had loose stools that are bright green in color.  He denies any melena or hematochezia.  The patient denies any previous abdominal surgeries.  He denies history of the same.  He denies any chronic NSAID use, alcohol abuse, or marijuana abuse.  Patient has been taking over-the-counter Tylenol for his pain without any relief.  Patient denies any fever, chills, chest pain, shortness breath, cough, lower abdominal pain, urinary symptoms, history of kidney stones, rash.  HPI  Past Medical History:  Diagnosis Date  . Depression age 39 - 66  . Sepsis ([place]) 03/2016   presumed sepsis following months of respiratory symptoms felt secondary to mold in living quarters    Patient Active Problem List   Diagnosis Date Noted  . Depression   . 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 Tattoo   . Former smoker   . Wheezing   . Acute bronchitis   . Lactic acidosis   . Onychomycosis   . Sepsis ([place]) 04/15/2016  . Hypokalemia 04/15/2016    History reviewed. No pertinent surgical history.      Home Medications    Prior to Admission medications   Medication Sig Start Date End Date Taking? Authorizing Provider    Flaxseed, Linseed, (FLAXSEED OIL PO) Take 2 tablets by mouth daily.   Yes [provider]  MILK THISTLE PO Take 1 drop by mouth daily.   Yes [provider]  OVER THE COUNTER MEDICATION Take 1 capsule by mouth daily as needed (anxiety). *CBD Oil Capsule* takes for anxiety COMBO FLAXSEED, BORAGE OIL, FISH OIL   Yes [provider]  albuterol (PROVENTIL HFA;VENTOLIN HFA) 108 (90 Base) MCG/ACT inhaler Inhale 1-2 puffs into the lungs every 6 (six) hours as needed for wheezing or shortness of breath.    [provider]  aspirin 81 MG tablet Take 324-486 mg by mouth daily as needed (DVT CONCERNS).    [provider]  fexofenadine (ALLEGRA) 180 MG tablet Take 1 tablet (180 mg total) by mouth daily. Patient not taking: Reported on 04/09/2017 09/18/16   Mack Hook, MD  fluticasone [facility]) 50 MCG/ACT nasal spray Place 2 sprays into both nostrils daily. Patient not taking: Reported on 04/09/2017 09/18/16   Mack Hook, MD  methocarbamol (ROBAXIN) 500 MG tablet Take 1 tablet (500 mg total) by mouth 3 (three) times daily between meals as needed. Patient not taking: Reported on 04/09/2017 05/22/16   Tanna Furry, MD  naproxen (NAPROSYN) 500 MG tablet Take 1 tablet (500 mg total) by mouth 2 (two) times daily. Patient not taking: Reported on 04/09/2017 11/06/16   Dorie Rank, MD  Family History Family History  Problem Relation Age of Onset  . Osteoarthritis Mother   . Blindness Sister   . Hypothyroidism Brother   . Hypothyroidism Sister   . Asthma Neg Hx   . Allergic Disorder Neg Hx     Social History Social History   Tobacco Use  . Smoking status: Former Smoker    Packs/day: 0.50    Years: 17.00    Pack years: 8.50    Types: Cigarettes  . Smokeless tobacco: Never Used  Substance Use Topics  . Alcohol use: Yes    Comment: twice weekly  . Drug use: Yes    Types: Marijuana    Comment: MJ in teens only     Allergies   Sulfa  antibiotics   Review of Systems Review of Systems  All other systems reviewed and are negative.    Physical Exam Updated Vital Signs BP (!) 142/80 (BP Location: Left Arm)   Pulse 87   Temp 99 F (37.2 C) (Oral)   Resp 18   Ht 5' 10"  (1.778 m)   Wt 124.7 kg (275 lb)   SpO2 100%   BMI 39.46 kg/m   Physical Exam  Constitutional: He appears well-developed and well-nourished.  HENT:  Head: Normocephalic and atraumatic.  Right Ear: External ear normal.  Left Ear: External ear normal.  Nose: Nose normal.  Mouth/Throat: Uvula is midline, oropharynx is clear and moist and mucous membranes are normal. No tonsillar exudate.  Eyes: Pupils are equal, round, and reactive to light. Right eye exhibits no discharge. Left eye exhibits no discharge. No scleral icterus.  Neck: Trachea normal. Neck supple. No spinous process tenderness present. No neck rigidity. Normal range of motion present.  Cardiovascular: Normal rate, regular rhythm and intact distal pulses.  No murmur heard. Pulses:      Radial pulses are 2+ on the right side, and 2+ on the left side.       Dorsalis pedis pulses are 2+ on the right side, and 2+ on the left side.       Posterior tibial pulses are 2+ on the right side, and 2+ on the left side.  No lower extremity swelling or edema. Calves symmetric in size bilaterally.  Pulmonary/Chest: Effort normal and breath sounds normal. He exhibits no tenderness.  Abdominal: Soft. Bowel sounds are normal. He exhibits no distension. There is tenderness in the right upper quadrant. There is no rigidity, no rebound, no guarding, no CVA tenderness and negative Murphy's sign.    Musculoskeletal: He exhibits no edema.  Lymphadenopathy:    He has no cervical adenopathy.  Neurological: He is alert.  Skin: Skin is warm, dry and intact. Capillary refill takes less than 2 seconds. No rash noted. He is not diaphoretic.  No vesicular-like rash on the right side of the patient's abdomen    Psychiatric: He has a normal mood and affect.  Nursing note and vitals reviewed.    ED Treatments / Results  Labs (all labs ordered are listed, but only abnormal results are displayed) Labs Reviewed  COMPREHENSIVE METABOLIC PANEL - Abnormal; Notable for the following components:      Result Value   Glucose, Bld 216 (*)    ALT 56 (*)    All other components within normal limits  CBC - Abnormal; Notable for the following components:   MCHC 36.1 (*)    All other components within normal limits  LIPASE, BLOOD  URINALYSIS, ROUTINE W REFLEX MICROSCOPIC    EKG None  Radiology US Abdomen Limited Ruq  Result Date: 11/17/2017 CLINICAL DATA:  Right side abdomen pain for 2 weeks. EXAM: ULTRASOUND ABDOMEN LIMITED RIGHT UPPER QUADRANT COMPARISON:  None. FINDINGS: Gallbladder: No gallstones or wall thickening visualized. No sonographic Murphy sign noted by sonographer. Common bile duct: Diameter: 6.5 mm Liver: No focal lesion identified. There is diffuse increased echotexture of the liver limiting evaluation of focal liver lesion. Portal vein is patent on color Doppler imaging with normal direction of blood flow towards the liver. IMPRESSION: Normal gallbladder. Fatty infiltration of liver. Electronically Signed   By: Abelardo Diesel M.D.   On: 11/17/2017 19:31    Procedures Procedures (including critical care time)  Medications Ordered in ED Medications  sodium chloride 0.9 % bolus 1,000 mL (1,000 mLs Intravenous New Bag/Given 11/17/17 1843)  ondansetron (ZOFRAN) injection 4 mg (4 mg Intravenous Given 11/17/17 1844)  morphine 4 MG/ML injection 4 mg (4 mg Intravenous Given 11/17/17 1844)     Initial Impression / Assessment and Plan / ED Course  I have reviewed the triage vital signs and the nursing notes.  Pertinent labs & imaging results that were available during my care of the patient were reviewed by me and considered in my medical decision making (see chart for details).     32  y.o. male who presents emergency department today for abdominal pain on the right side.  Patient states that over the last 2 months she has been noticing increasing swelling of his abdomen.  She states for the last 2 weeks he has been having constant pain on the right mid/upper portion of his abdomen that radiates/wraps around to his right back.  He notes associated burping and belching as well as abdominal bloating.  He notes after eating he feels very nauseous but does not have any emesis.  He notes over the last several days he has had loose stools that are bright green in color.  He denies any melena or hematochezia.  The patient denies any previous abdominal surgeries.  He denies history of the same.  He denies any chronic NSAID use, alcohol abuse, or marijuana abuse.  Patient has been taking over-the-counter Tylenol for his pain without any relief.  Patient denies any fever, chills, chest pain, shortness breath, cough, lower abdominal pain, urinary symptoms, history of kidney stones, rash.  On presentation patient is without fever, tachycardia, tachypnea, hypoxia or hypotension.  He is non-ill and nonseptic appearing.  Patient is without any CVA tenderness.  Lungs are clear to auscultation bilaterally.  Patient is with soft abdomen that is nondistended.  With deep palpation there is mild tenderness to the right upper quadrant.  No right lower quadrant tenderness palpation or McBurney's point tenderness palpation making concern for appendicitis at this time.  Will order right upper quadrant ultrasound.  Labs reviewed and reassuring.  He is without leukocytosis.  No anemia.  No significant electrolyte derangements.  Kidney function within normal limits.  ALT mildly elevated.  No other LFT derangements.  No evidence of DKA.  UA without UTI.  No hemoglobin in the urine.  No CVA tenderness.  Have a low suspicion for kidney stone.  Lipase within normal limits.  Right upper quadrant ultrasound with normal  gallbladder.  There is a fatty infiltration of the liver. Repeat abdominal exam without any ttp. The evaluation does not show pathology that would require ongoing emergent intervention or inpatient treatment.  No concern for bowel obstruction, appendicitis or diverticulitis. I advised the patient to follow-up with PCP  and GI this week. I advised the patient to return to the emergency department with new or worsening symptoms or new concerns. Specific return precautions discussed. The patient verbalized understanding and agreement with plan. All questions answered. No further questions at this time. The patient is hemodynamically stable, mentating appropriately and appears safe for discharge.  Final Clinical Impressions(s) / ED Diagnoses   Final diagnoses:  Right sided abdominal pain    ED Discharge Orders    None       Lorelle Gibbs 11/17/17 2118    Davonna Belling, MD 11/17/17 [phone]

## 2017-11-17 NOTE — Discharge Instructions (Addendum)
Please read and follow all provided instructions You have been seen today for your complaint of: abdominal pain Your blood work and imaging was reassuring.  Your ultrasound did show that you had a fatty infiltrate on the liver.  Please follow-up with gastrology for this.  Avoid Tylenol and alcohol.  See attached handout. Vital signs: See below  Abdominal Pain  Your exam might not show the exact reason you have abdominal pain. Since there are many different causes of abdominal pain, another checkup and more tests may be needed. It is very important to follow up for lasting (persistent) or worsening symptoms. A possible cause of abdominal pain in any person who still has his or her appendix is acute appendicitis. Appendicitis is often hard to diagnose. Normal blood tests, urine tests, ultrasound, and CT scans do not completely rule out early appendicitis or other causes of abdominal pain. Sometimes, only the changes that happen over time will allow appendicitis and other causes of abdominal pain to be determined. Other potential problems that may require surgery may also take time to become more apparent. Because of this, it is important that you follow all of the instructions below.   HOME CARE INSTRUCTIONS  Do not take laxatives unless directed by your caregiver. Rest as much as possible.  Do not eat solid food until your pain is gone: A diet of water, weak decaffeinated tea, broth or bouillon, gelatin, oral rehydration solutions (ORS), frozen ice pops, or ice chips may be helpful.  When pain is gone: Start a light diet (dry toast, crackers, applesauce, or white rice). Increase the diet slowly as long as it does not bother you. Eat no dairy products (including cheese and eggs) and no spicy, fatty, fried, or high-fiber foods.  Use no alcohol, caffeine, or cigarettes.  Take your regular medicines unless your caregiver told you not to.  Take any prescribed medicine as directed.   SEEK IMMEDIATE MEDICAL  CARE IF:  The pain does not go away.  You have a fever >101 that does not go down with medication. You keep throwing up (vomiting) or cannot drink liquids.  You have shaking chills (rigors) The pain becomes localized (Pain in the right side could possibly be appendicitis. In an adult, pain in the left lower portion of the abdomen could be colitis or diverticulitis). You pass bloody or black tarry stools.  There is bright red blood in the stool.  There is blood in your vomit. Your bowel movements stop (become blocked) or you cannot pass gas.  The constipation stays for more than 4 days.  You have bloody, frequent, or painful urination.  You have yellow discoloration in the skin or whites of the eyes.  Your stomach becomes bloated or bigger.  You have dizziness or fainting.  You have chest or back pain. You have rectal pain.  You do not seem to be getting better.  You have any questions or concerns.   Your vital signs today were: BP (!) 142/80 (BP Location: Left Arm)    Pulse 87    Temp 99 F (37.2 C) (Oral)    Resp 18    Ht 5' 10"  (1.778 m)    Wt 124.7 kg (275 lb)    SpO2 100%    BMI 39.46 kg/m  If your blood pressure (bp) was elevated above 135/85 this visit, please have this repeated by your doctor within one month.

## 2017-11-17 NOTE — ED Triage Notes (Signed)
Patient c/o intermittent right mid abdominal pain that radiates into the right mid back area x 2 weeks. pateant also c/o abdominal bloating on the right side. Patient also c/o nausea and diarrhea. Patient reported that his diarrhea is green in color.

## 2017-11-23 ENCOUNTER — Other Ambulatory Visit (HOSPITAL_COMMUNITY): Payer: Self-pay | Admitting: Gastroenterology

## 2017-11-23 DIAGNOSIS — R1011 Right upper quadrant pain: Secondary | ICD-10-CM

## 2017-12-01 ENCOUNTER — Encounter (HOSPITAL_COMMUNITY): Admission: RE | Admit: 2017-12-01 | Payer: Self-pay | Source: Ambulatory Visit

## 2017-12-06 ENCOUNTER — Ambulatory Visit (HOSPITAL_COMMUNITY)
Admission: RE | Admit: 2017-12-06 | Discharge: 2017-12-06 | Disposition: A | Discharge status: Home / Self Care | Payer: Self-pay | Source: Ambulatory Visit | Attending: Gastroenterology | Admitting: Gastroenterology

## 2017-12-06 DIAGNOSIS — R1011 Right upper quadrant pain: Secondary | ICD-10-CM | POA: Insufficient documentation

## 2017-12-06 MED ORDER — TECHNETIUM TC 99M MEBROFENIN IV KIT
5.3000 | PACK | Freq: Once | INTRAVENOUS | Status: AC | PRN
  Administered 2017-12-06: 5.3 via INTRAVENOUS

## 2018-02-21 ENCOUNTER — Encounter (HOSPITAL_COMMUNITY): Payer: Self-pay | Admitting: *Deleted

## 2018-02-21 ENCOUNTER — Other Ambulatory Visit: Payer: Self-pay

## 2018-02-21 ENCOUNTER — Emergency Department (HOSPITAL_COMMUNITY)
Admission: EM | Admit: 2018-02-21 | Discharge: 2018-02-21 | Disposition: A | Discharge status: Home / Self Care | Payer: Self-pay | Attending: Emergency Medicine | Admitting: Emergency Medicine

## 2018-02-21 ENCOUNTER — Emergency Department (HOSPITAL_COMMUNITY): Payer: Self-pay

## 2018-02-21 DIAGNOSIS — Z7982 Long term (current) use of aspirin: Secondary | ICD-10-CM | POA: Insufficient documentation

## 2018-02-21 DIAGNOSIS — R0789 Other chest pain: Secondary | ICD-10-CM

## 2018-02-21 DIAGNOSIS — R079 Chest pain, unspecified: Secondary | ICD-10-CM | POA: Insufficient documentation

## 2018-02-21 DIAGNOSIS — Z87891 Personal history of nicotine dependence: Secondary | ICD-10-CM | POA: Insufficient documentation

## 2018-02-21 DIAGNOSIS — Z79899 Other long term (current) drug therapy: Secondary | ICD-10-CM | POA: Insufficient documentation

## 2018-02-21 LAB — I-STAT TROPONIN, ED: Troponin i, poc: 0 ng/mL (ref 0.00–0.08)

## 2018-02-21 LAB — CBC WITH DIFFERENTIAL/PLATELET
ABS IMMATURE GRANULOCYTES: 0 10*3/uL (ref 0.0–0.1)
BASOS PCT: 0 %
Basophils Absolute: 0 10*3/uL (ref 0.0–0.1)
EOS ABS: 0.4 10*3/uL (ref 0.0–0.7)
Eosinophils Relative: 4 %
HEMATOCRIT: 46.9 % (ref 39.0–52.0)
Hemoglobin: 15.7 g/dL (ref 13.0–17.0)
Immature Granulocytes: 0 %
LYMPHS ABS: 4.4 10*3/uL — AB (ref 0.7–4.0)
Lymphocytes Relative: 43 %
MCH: 29.3 pg (ref 26.0–34.0)
MCHC: 33.5 g/dL (ref 30.0–36.0)
MCV: 87.7 fL (ref 78.0–100.0)
MONO ABS: 0.9 10*3/uL (ref 0.1–1.0)
MONOS PCT: 9 %
NEUTROS ABS: 4.5 10*3/uL (ref 1.7–7.7)
Neutrophils Relative %: 44 %
PLATELETS: 295 10*3/uL (ref 150–400)
RBC: 5.35 MIL/uL (ref 4.22–5.81)
RDW: 12.2 % (ref 11.5–15.5)
WBC: 10.2 10*3/uL (ref 4.0–10.5)

## 2018-02-21 LAB — COMPREHENSIVE METABOLIC PANEL
ALBUMIN: 3.9 g/dL (ref 3.5–5.0)
ALK PHOS: 53 U/L (ref 38–126)
ALT: 27 U/L (ref 0–44)
AST: 24 U/L (ref 15–41)
Anion gap: 8 (ref 5–15)
BUN: 12 mg/dL (ref 6–20)
CALCIUM: 9.1 mg/dL (ref 8.9–10.3)
CHLORIDE: 108 mmol/L (ref 98–111)
CO2: 22 mmol/L (ref 22–32)
CREATININE: 0.91 mg/dL (ref 0.61–1.24)
GFR calc Af Amer: >60 mL/min (ref >60)
GFR calc non Af Amer: >60 mL/min (ref >60)
GLUCOSE: 112 mg/dL — AB (ref 70–99)
Potassium: 3.5 mmol/L (ref 3.5–5.1)
Sodium: 138 mmol/L (ref 135–145)
Total Bilirubin: 0.4 mg/dL (ref 0.3–1.2)
Total Protein: 7 g/dL (ref 6.5–8.1)

## 2018-02-21 LAB — D-DIMER, QUANTITATIVE (NOT AT ARMC): D DIMER QUANT: 0.61 ug{FEU}/mL — AB (ref 0.00–0.50)

## 2018-02-21 MED ORDER — IBUPROFEN 400 MG PO TABS
600.0000 mg | ORAL_TABLET | Freq: Once | ORAL | Status: AC
  Administered 2018-02-21: 600 mg via ORAL
  Filled 2018-02-21: qty 1

## 2018-02-21 MED ORDER — IOPAMIDOL (ISOVUE-370) INJECTION 76%
INTRAVENOUS | Status: AC
  Administered 2018-02-21: 100 mL
  Filled 2018-02-21: qty 100

## 2018-02-21 MED ORDER — HYDROXYZINE HCL 25 MG PO TABS: 25.0000 mg | ORAL_TABLET | Freq: Four times a day (QID) | ORAL | 0 refills | Status: DC

## 2018-02-21 NOTE — ED Notes (Signed)
Pt reports that over the past 2 months he has taken himself off of his Klonopin because he does not want to be on them. Reports tapering them over this time.  Also reports family stresses.

## 2018-02-21 NOTE — ED Notes (Signed)
Patient transported to CT 

## 2018-02-21 NOTE — ED Triage Notes (Signed)
States his dad is really sick and for the last 20 days patient has had a anxious and feeling of sob, states he can't sleep more than 5 hours a night and he always wakes up on his back which is totally abnormal. States he took himself off [place]  About a month ago

## 2018-02-21 NOTE — Discharge Instructions (Addendum)
Your lab results were reassuring.  There was a rather nonspecific finding of possibly increased right heart pressure, but no evidence of blood clot.  Follow-up with your primary care provider on this matter.  May try taking hydroxyzine for anxiety and/or sleep.  Use caution as this medication can cause drowsiness.  Antiinflammatory medications: Take 600 mg of ibuprofen every 6 hours or 440 mg (over the counter dose) to 500 mg (prescription dose) of naproxen every 12 hours for the next 3 days. After this time, these medications may be used as needed for pain. Take these medications with food to avoid upset stomach. Choose only one of these medications, do not take them together. Acetaminophen (generic for Tylenol): Should you continue to have additional pain while taking the ibuprofen or naproxen, you may add in acetaminophen as needed. Your daily total maximum amount of acetaminophen from all sources should be limited to 4079m/day for persons without liver problems, or 20053mday for those with liver problems.

## 2018-02-21 NOTE — ED Notes (Signed)
ED Provider at bedside. 

## 2018-02-21 NOTE — ED Provider Notes (Signed)
[facility] EMERGENCY DEPARTMENT Provider Note   CSN: [id number] Arrival date & time: 02/21/18  3734     History   Chief Complaint Chief Complaint  Patient presents with  . Shortness of Breath    HPI Jared Hunt is a 32 y.o. male.  HPI   Jared Hunt is a 32 y.o. male, with a history of depression, presenting to the ED with chest discomfort for last 3 weeks.  Discomfort is central chest, described as irritation and tingling, constant, minor, nonradiating.  Accompanied by occasional palpitations and shortness of breath.  Symptoms do not seem to be exertional.  He notes when he lays prone or on his side, the discomfort seems to worsen and his heart starts to race.  He also endorses poor sleep. Has been under a large amount of stress, "My dad has been really sick, I've been having to deal with all of his stuff, and I'm overwhelmed."  States he removed himself from [place] about a month ago, "had some withdrawal symptoms," but is now feeling better.  He has been using CBD oil.  Denies illicit drug use.  Uses alcohol about twice a month.  Denies SI/HI.  Denies fever/chills, lower extremity edema or pain, abdominal pain, N/V/C/D, syncope, cough, diaphoresis, dizziness, or any other complaints.      Past Medical History:  Diagnosis Date  . Depression age 63 - 89  . Sepsis ([place]) 03/2016   presumed sepsis following months of respiratory symptoms felt secondary to mold in living quarters    Patient Active Problem List   Diagnosis Date Noted  . Depression   . Tattoo   . Former smoker   . Wheezing   . Acute bronchitis   . Lactic acidosis   . Onychomycosis   . Sepsis ([place]) 04/15/2016  . Hypokalemia 04/15/2016    History reviewed. No pertinent surgical history.      Home Medications    Prior to Admission medications   Medication Sig Start Date End Date Taking? Authorizing Provider  albuterol (PROVENTIL HFA;VENTOLIN HFA) 108 (90 Base)  MCG/ACT inhaler Inhale 1-2 puffs into the lungs every 6 (six) hours as needed for wheezing or shortness of breath.   Yes [provider]  aspirin 81 MG tablet Take 324-486 mg by mouth daily as needed (DVT CONCERNS).   Yes [provider]  MILK THISTLE PO Take 1 drop by mouth every other day.    Yes [provider]  OVER THE COUNTER MEDICATION Take 1 capsule by mouth daily as needed (anxiety). *CBD Oil Capsule* takes for anxiety COMBO FLAXSEED, BORAGE OIL, FISH OIL   Yes [provider]  fexofenadine (ALLEGRA) 180 MG tablet Take 1 tablet (180 mg total) by mouth daily. Patient not taking: Reported on 04/09/2017 09/18/16   Mack Hook, MD  fluticasone [facility]) 50 MCG/ACT nasal spray Place 2 sprays into both nostrils daily. Patient not taking: Reported on 04/09/2017 09/18/16   Mack Hook, MD  hydrOXYzine (ATARAX/VISTARIL) 25 MG tablet Take 1 tablet (25 mg total) by mouth every 6 (six) hours. 02/21/18   Blaike Vickers C, PA-C  methocarbamol (ROBAXIN) 500 MG tablet Take 1 tablet (500 mg total) by mouth 3 (three) times daily between meals as needed. Patient not taking: Reported on 04/09/2017 05/22/16   Tanna Furry, MD  naproxen (NAPROSYN) 500 MG tablet Take 1 tablet (500 mg total) by mouth 2 (two) times daily. Patient not taking: Reported on 04/09/2017 11/06/16   Dorie Rank, MD  Family History Family History  Problem Relation Age of Onset  . Osteoarthritis Mother   . Blindness Sister   . Hypothyroidism Brother   . Hypothyroidism Sister   . Asthma Neg Hx   . Allergic Disorder Neg Hx     Social History Social History   Tobacco Use  . Smoking status: Former Smoker    Packs/day: 0.50    Years: 17.00    Pack years: 8.50    Types: Cigarettes  . Smokeless tobacco: Never Used  Substance Use Topics  . Alcohol use: Yes    Comment: twice weekly  . Drug use: Yes    Types: Marijuana    Comment: MJ in teens only     Allergies   Sulfa  antibiotics   Review of Systems Review of Systems  Constitutional: Negative for chills, diaphoresis and fever.  Respiratory: Positive for shortness of breath (occasional). Negative for cough.   Cardiovascular: Positive for chest pain and palpitations. Negative for leg swelling.  Gastrointestinal: Negative for abdominal pain, diarrhea, nausea and vomiting.  Musculoskeletal: Negative for back pain.  Neurological: Negative for dizziness, syncope, weakness, light-headedness and headaches.  All other systems reviewed and are negative.    Physical Exam Updated Vital Signs BP (!) 150/97 (BP Location: Right Arm)   Pulse (!) 115   Temp 98.1 F (36.7 C) (Oral)   Resp 18   Ht 5' 9"  (1.753 m)   Wt 113.4 kg   SpO2 99%   BMI 36.92 kg/m   Physical Exam  Constitutional: He appears well-developed and well-nourished. No distress.  HENT:  Head: Normocephalic and atraumatic.  Eyes: Conjunctivae are normal.  Neck: Neck supple.  Cardiovascular: Normal rate, regular rhythm, normal heart sounds and intact distal pulses.  Though initially tachycardic, patient's pulse is 84 on my exam.  Pulmonary/Chest: Effort normal and breath sounds normal. No respiratory distress.  No increased work of breathing.  Speaks in full sentences without difficulty.  Abdominal: Soft. There is no tenderness. There is no guarding.  Musculoskeletal: He exhibits no edema.  Lymphadenopathy:    He has no cervical adenopathy.  Neurological: He is alert.  Skin: Skin is warm and dry. He is not diaphoretic.  Psychiatric: He has a normal mood and affect. His behavior is normal.  Nursing note and vitals reviewed.    ED Treatments / Results  Labs (all labs ordered are listed, but only abnormal results are displayed) Labs Reviewed  COMPREHENSIVE METABOLIC PANEL - Abnormal; Notable for the following components:      Result Value   Glucose, Bld 112 (*)    All other components within normal limits  CBC WITH  DIFFERENTIAL/PLATELET - Abnormal; Notable for the following components:   Lymphs Abs 4.4 (*)    All other components within normal limits  D-DIMER, QUANTITATIVE (NOT AT [facility]) - Abnormal; Notable for the following components:   D-Dimer, Quant 0.61 (*)    All other components within normal limits  I-STAT TROPONIN, ED    EKG EKG Interpretation  Date/Time:  Monday February 21 2018 07:48:34 EDT Ventricular Rate:  79 PR Interval:    QRS Duration: 90 QT Interval:  360 QTC Calculation: 413 R Axis:   78 Text Interpretation:  Sinus rhythm Nonspecific ST abnormality `no acute change from ecg 06/2017 Confirmed by Lajean Saver [phone]) on 02/21/2018 7:54:11 AM   Radiology Dg Chest 2 View  Result Date: 02/21/2018 CLINICAL DATA:  Chest pain EXAM: CHEST - 2 VIEW COMPARISON:  Sep 23, 2017 FINDINGS: No  edema or consolidation. The heart size and pulmonary vascularity are normal. No adenopathy. No pneumothorax. No bone lesions. IMPRESSION: No edema or consolidation. Electronically Signed   By: Lowella Grip III M.D.   On: 02/21/2018 13:49   Ct Angio Chest Pe W And/or Wo Contrast  Result Date: 02/21/2018 CLINICAL DATA:  Chest tightness with elevated D-dimer EXAM: CT ANGIOGRAPHY CHEST WITH CONTRAST TECHNIQUE: Multidetector CT imaging of the chest was performed using the standard protocol during bolus administration of intravenous contrast. Multiplanar CT image reconstructions and MIPs were obtained to evaluate the vascular anatomy. CONTRAST:  182m ISOVUE-370 IOPAMIDOL (ISOVUE-370) INJECTION 76% COMPARISON:  CT angiogram chest April 09, 2017; chest radiograph February 21, 2018 FINDINGS: Cardiovascular: There is no demonstrable pulmonary embolus. There is no thoracic aortic aneurysm. No dissection evident. The contrast bolus in the aorta is not sufficient to assess for dissection as a differential consideration on this study. Visualized great vessels appear unremarkable. Note that the right innominate  and left common carotid arteries arise as a common trunk, an anatomic variant. No pericardial effusion or pericardial thickening evident. Mediastinum/Nodes: Visualized thyroid appears normal. There is no appreciable thoracic adenopathy. Mild residual thymic tissue is normal for age. No esophageal lesions are appreciable. Lungs/Pleura: Lungs are clear. No edema or consolidation evident. No pleural effusion or pleural thickening. Upper Abdomen: There is reflux of contrast into the inferior vena cava and hepatic veins. Visualized upper abdominal structures appear normal. Musculoskeletal: There are no blastic or lytic bone lesions. No chest wall lesions are evident. Review of the MIP images confirms the above findings. IMPRESSION: 1. No demonstrable pulmonary embolus. No thoracic aortic aneurysm. No dissection evident, although the contrast bolus is not sufficient in the aorta to assess for dissection as a differential consideration. 2. Reflux of contrast into the inferior vena cava and hepatic veins raises question of increase in right heart pressure. 3.  Lungs clear. 4.  No evident thoracic adenopathy. Electronically Signed   By: WLowella GripIII M.D.   On: 02/21/2018 10:13    Procedures Procedures (including critical care time)  Medications Ordered in ED Medications  ibuprofen (ADVIL,MOTRIN) tablet 600 mg (600 mg Oral Given 02/21/18 0815)  iopamidol (ISOVUE-370) 76 % injection (100 mLs  Contrast Given 02/21/18 0956)     Initial Impression / Assessment and Plan / ED Course  I have reviewed the triage vital signs and the nursing notes.  Pertinent labs & imaging results that were available during my care of the patient were reviewed by me and considered in my medical decision making (see chart for details).     Patient presents with seemingly vague symptoms of chest discomfort. Stress and anxiety are high on differential. Pericarditis is a possibility. Patient is nontoxic appearing, afebrile, not  tachypneic, not hypotensive, maintains excellent SPO2 on room air, and is in no apparent distress.  Initially tachycardic, but this resolved early in the ED course.  Low suspicion for ACS. No high risk/suspicious features; no exertional chest pain, vomiting, diaphoresis, or radiation. HEART score is 2, indicating low risk for a cardiac event. Wells criteria score is 1.5 based on initial tachycardia, indicating low risk for PE.  CT of the chest without evidence of PE.  Patient to follow-up with his PCP on these matters. The patient was given instructions for home care as well as return precautions. Patient voices understanding of these instructions, accepts the plan, and is comfortable with discharge.   Vitals:   02/21/18 0636 02/21/18 0528409/30/19 0730 02/21/18 0745  BP: (!) 150/97  119/82 114/76  Pulse: (!) 115  82 79  Resp: 18     Temp: 98.1 F (36.7 C)     TempSrc: Oral     SpO2: 99%  99% 98%  Weight:  113.4 kg    Height:  5' 9"  (1.753 m)     Vitals:   02/21/18 1009 02/21/18 1015 02/21/18 1034 02/21/18 1038  BP:  129/78    Pulse: 68 67 81 76  Resp: 13 13 16 14   Temp:      TempSrc:      SpO2: 99% 99% 100% 100%  Weight:      Height:         Final Clinical Impressions(s) / ED Diagnoses   Final diagnoses:  Chest discomfort    ED Discharge Orders         Ordered    hydrOXYzine (ATARAX/VISTARIL) 25 MG tablet  Every 6 hours     02/21/18 1049           Lorayne Bender, PA-C 02/21/18 1526    Lajean Saver, MD 02/22/18 [phone]

## 2018-02-23 ENCOUNTER — Other Ambulatory Visit: Payer: Self-pay

## 2018-02-23 ENCOUNTER — Emergency Department (HOSPITAL_COMMUNITY)
Admission: EM | Admit: 2018-02-23 | Discharge: 2018-02-23 | Disposition: A | Discharge status: Home / Self Care | Payer: Self-pay | Attending: Emergency Medicine | Admitting: Emergency Medicine

## 2018-02-23 ENCOUNTER — Emergency Department (HOSPITAL_COMMUNITY): Payer: Self-pay

## 2018-02-23 ENCOUNTER — Encounter (HOSPITAL_COMMUNITY): Payer: Self-pay | Admitting: Emergency Medicine

## 2018-02-23 DIAGNOSIS — F410 Panic disorder [episodic paroxysmal anxiety] without agoraphobia: Secondary | ICD-10-CM | POA: Insufficient documentation

## 2018-02-23 DIAGNOSIS — R079 Chest pain, unspecified: Secondary | ICD-10-CM | POA: Insufficient documentation

## 2018-02-23 DIAGNOSIS — Z87891 Personal history of nicotine dependence: Secondary | ICD-10-CM | POA: Insufficient documentation

## 2018-02-23 DIAGNOSIS — F1210 Cannabis abuse, uncomplicated: Secondary | ICD-10-CM | POA: Insufficient documentation

## 2018-02-23 DIAGNOSIS — R002 Palpitations: Secondary | ICD-10-CM | POA: Insufficient documentation

## 2018-02-23 DIAGNOSIS — R140 Abdominal distension (gaseous): Secondary | ICD-10-CM | POA: Insufficient documentation

## 2018-02-23 DIAGNOSIS — F419 Anxiety disorder, unspecified: Secondary | ICD-10-CM | POA: Insufficient documentation

## 2018-02-23 DIAGNOSIS — R000 Tachycardia, unspecified: Secondary | ICD-10-CM | POA: Insufficient documentation

## 2018-02-23 HISTORY — DX: Anxiety disorder, unspecified: F41.9

## 2018-02-23 NOTE — ED Triage Notes (Addendum)
Pt presents with SOB and anxiety. Pt states he recently stopped taking his anxiety medication because he doesn't want to be on benzo's. Pt states he was sleeping and woke up after an hour SOB. Pt denies SOB at this time but endorses anxiety and fast heart rate. Pt states he was just here and told he had pressure on the right side of his heart.

## 2018-02-23 NOTE — ED Provider Notes (Signed)
[facility] EMERGENCY DEPARTMENT Provider Note   CSN: [id number] Arrival date & time: 02/23/18  0141     History   Chief Complaint Chief Complaint  Patient presents with  . Shortness of Breath  . Panic Attack  . Tachycardia    HPI Jared Hunt is a 32 y.o. male.  Patient is present in the ER today with a multitude of complaints.  Patient was seen yesterday for chest pain.  He had a CT angiography performed that showed the possibility of increased right heart pressures.  Patient has been on [employer] since then and thinks that he is showing symptoms of significant pulmonary hypertension now.  Patient has history of anxiety and panic disorder, has been experiencing shortness of breath, heart palpitations.  He continues to have intermittent episodes of chest pain.  In addition to this he has noticed that his abdomen is distended.  It started after he took a homeopathic "destressor" yesterday after he received the results.     Past Medical History:  Diagnosis Date  . Anxiety   . Depression age 50 - 85  . Sepsis ([place]) 03/2016   presumed sepsis following months of respiratory symptoms felt secondary to mold in living quarters    Patient Active Problem List   Diagnosis Date Noted  . Depression   . Tattoo   . Former smoker   . Wheezing   . Acute bronchitis   . Lactic acidosis   . Onychomycosis   . Sepsis ([place]) 04/15/2016  . Hypokalemia 04/15/2016    History reviewed. No pertinent surgical history.      Home Medications    Prior to Admission medications   Medication Sig Start Date End Date Taking? Authorizing Provider  albuterol (PROVENTIL HFA;VENTOLIN HFA) 108 (90 Base) MCG/ACT inhaler Inhale 1-2 puffs into the lungs every 6 (six) hours as needed for wheezing or shortness of breath.    [provider]  aspirin 81 MG tablet Take 324-486 mg by mouth daily as needed (DVT CONCERNS).    [provider]  fexofenadine (ALLEGRA) 180 MG  tablet Take 1 tablet (180 mg total) by mouth daily. Patient not taking: Reported on 04/09/2017 09/18/16   Mack Hook, MD  fluticasone [facility]) 50 MCG/ACT nasal spray Place 2 sprays into both nostrils daily. Patient not taking: Reported on 04/09/2017 09/18/16   Mack Hook, MD  hydrOXYzine (ATARAX/VISTARIL) 25 MG tablet Take 1 tablet (25 mg total) by mouth every 6 (six) hours. 02/21/18   Joy, Shawn C, PA-C  methocarbamol (ROBAXIN) 500 MG tablet Take 1 tablet (500 mg total) by mouth 3 (three) times daily between meals as needed. Patient not taking: Reported on 04/09/2017 05/22/16   Tanna Furry, MD  MILK THISTLE PO Take 1 drop by mouth every other day.     [provider]  naproxen (NAPROSYN) 500 MG tablet Take 1 tablet (500 mg total) by mouth 2 (two) times daily. Patient not taking: Reported on 04/09/2017 11/06/16   Dorie Rank, MD  OVER THE COUNTER MEDICATION Take 1 capsule by mouth daily as needed (anxiety). *CBD Oil Capsule* takes for anxiety COMBO FLAXSEED, BORAGE OIL, FISH OIL    [provider]    Family History Family History  Problem Relation Age of Onset  . Osteoarthritis Mother   . Blindness Sister   . Hypothyroidism Brother   . Hypothyroidism Sister   . Asthma Neg Hx   . Allergic Disorder Neg Hx     Social History Social History  Tobacco Use  . Smoking status: Former Smoker    Packs/day: 0.50    Years: 17.00    Pack years: 8.50    Types: Cigarettes  . Smokeless tobacco: Never Used  Substance Use Topics  . Alcohol use: Yes    Comment: twice weekly  . Drug use: Yes    Types: Marijuana    Comment: MJ in teens only     Allergies   Sulfa antibiotics   Review of Systems Review of Systems  Respiratory: Positive for shortness of breath.   Cardiovascular: Positive for chest pain and palpitations.  Gastrointestinal: Positive for abdominal distention.  All other systems reviewed and are negative.    Physical Exam Updated Vital  Signs BP 123/75 (BP Location: Right Arm)   Pulse 95   Temp 98.9 F (37.2 C)   Resp 20   Ht 5' 9"  (1.753 m)   Wt 113.4 kg   SpO2 99%   BMI 36.92 kg/m   Physical Exam  Constitutional: He is oriented to person, place, and time. He appears well-developed and well-nourished. No distress.  HENT:  Head: Normocephalic and atraumatic.  Right Ear: Hearing normal.  Left Ear: Hearing normal.  Nose: Nose normal.  Mouth/Throat: Oropharynx is clear and moist and mucous membranes are normal.  Eyes: Pupils are equal, round, and reactive to light. Conjunctivae and EOM are normal.  Neck: Normal range of motion. Neck supple.  Cardiovascular: Regular rhythm, S1 normal and S2 normal. Exam reveals no gallop and no friction rub.  No murmur heard. Pulmonary/Chest: Effort normal and breath sounds normal. No respiratory distress. He exhibits no tenderness.  Abdominal: Soft. Normal appearance and bowel sounds are normal. There is no hepatosplenomegaly. There is no tenderness. There is no rebound, no guarding, no tenderness at McBurney's point and negative Murphy's sign. No hernia.  Musculoskeletal: Normal range of motion.  Neurological: He is alert and oriented to person, place, and time. He has normal strength. No cranial nerve deficit or sensory deficit. Coordination normal. GCS eye subscore is 4. GCS verbal subscore is 5. GCS motor subscore is 6.  Skin: Skin is warm, dry and intact. No rash noted. No cyanosis.  Psychiatric: His speech is normal and behavior is normal. Thought content normal. His mood appears anxious.  Nursing note and vitals reviewed.    ED Treatments / Results  Labs (all labs ordered are listed, but only abnormal results are displayed) Labs Reviewed - No data to display  EKG None  Radiology Dg Chest 2 View  Result Date: 02/21/2018 CLINICAL DATA:  Chest pain EXAM: CHEST - 2 VIEW COMPARISON:  Sep 23, 2017 FINDINGS: No edema or consolidation. The heart size and pulmonary vascularity  are normal. No adenopathy. No pneumothorax. No bone lesions. IMPRESSION: No edema or consolidation. Electronically Signed   By: Lowella Grip III M.D.   On: 02/21/2018 13:49   Ct Angio Chest Pe W And/or Wo Contrast  Result Date: 02/21/2018 CLINICAL DATA:  Chest tightness with elevated D-dimer EXAM: CT ANGIOGRAPHY CHEST WITH CONTRAST TECHNIQUE: Multidetector CT imaging of the chest was performed using the standard protocol during bolus administration of intravenous contrast. Multiplanar CT image reconstructions and MIPs were obtained to evaluate the vascular anatomy. CONTRAST:  139m ISOVUE-370 IOPAMIDOL (ISOVUE-370) INJECTION 76% COMPARISON:  CT angiogram chest April 09, 2017; chest radiograph February 21, 2018 FINDINGS: Cardiovascular: There is no demonstrable pulmonary embolus. There is no thoracic aortic aneurysm. No dissection evident. The contrast bolus in the aorta is not sufficient to assess  for dissection as a differential consideration on this study. Visualized great vessels appear unremarkable. Note that the right innominate and left common carotid arteries arise as a common trunk, an anatomic variant. No pericardial effusion or pericardial thickening evident. Mediastinum/Nodes: Visualized thyroid appears normal. There is no appreciable thoracic adenopathy. Mild residual thymic tissue is normal for age. No esophageal lesions are appreciable. Lungs/Pleura: Lungs are clear. No edema or consolidation evident. No pleural effusion or pleural thickening. Upper Abdomen: There is reflux of contrast into the inferior vena cava and hepatic veins. Visualized upper abdominal structures appear normal. Musculoskeletal: There are no blastic or lytic bone lesions. No chest wall lesions are evident. Review of the MIP images confirms the above findings. IMPRESSION: 1. No demonstrable pulmonary embolus. No thoracic aortic aneurysm. No dissection evident, although the contrast bolus is not sufficient in the aorta  to assess for dissection as a differential consideration. 2. Reflux of contrast into the inferior vena cava and hepatic veins raises question of increase in right heart pressure. 3.  Lungs clear. 4.  No evident thoracic adenopathy. Electronically Signed   By: Lowella Grip III M.D.   On: 02/21/2018 10:13   Dg Abd Acute W/chest  Result Date: 02/23/2018 CLINICAL DATA:  32 y/o M; shortness of breath tonight. Two months of abdominal distention. EXAM: DG ABDOMEN ACUTE W/ 1V CHEST COMPARISON:  None. FINDINGS: There is no evidence of dilated bowel loops or free intraperitoneal air. No radiopaque calculi or other significant radiographic abnormality is seen. Heart size and mediastinal contours are within normal limits. Both lungs are clear. Mild S-shaped curvature of the spine. IMPRESSION: Negative abdominal radiographs.  No acute cardiopulmonary disease. Electronically Signed   By: Kristine Garbe M.D.   On: 02/23/2018 03:46    Procedures Procedures (including critical care time)  Medications Ordered in ED Medications - No data to display   Initial Impression / Assessment and Plan / ED Course  I have reviewed the triage vital signs and the nursing notes.  Pertinent labs & imaging results that were available during my care of the patient were reviewed by me and considered in my medical decision making (see chart for details).     Patient presents with concerns over the possibility of pulmonary hypertension raised when he was seen in the ER yesterday.  He continues to have the chest pain, heart palpitations and shortness of breath.  I believe that this is secondary to his anxiety and panic disorder.  He reports that his father recently had a brain bleed secondary to a ruptured aneurysm and now has significant disability and he is helping care for him as well as managing his estate.  This is caused him great stress.  Patient seems to have become extremely concerned over the findings of his CT  yesterday.  Reviewing his records reveal that there was "Reflux of contrast into the inferior vena cava and hepatic veins raises question of increase in right heart pressure".  Patient informed that this does not definitively diagnose increased pressures and that he needs further work-up.  Will refer to cardiology for evaluation of the palpitations as well as to ensure that he does not have elevated right heart pressures.  Patient was encouraged to use the hydroxyzine that was prescribed yesterday for his anxiety.  He has been having some difficulty sleeping, he was informed that the hydroxyzine might help with this as well.  Patient's abdominal exam is benign, nontender.  X-ray does not show any unusual gas patterns, no  evidence of obstruction.   Final Clinical Impressions(s) / ED Diagnoses   Final diagnoses:  Palpitations  Anxiety    ED Discharge Orders    None       Lova Urbieta, Gwenyth Allegra, MD 02/23/18 [phone]

## 2018-02-23 NOTE — ED Notes (Signed)
Phlebotomy drew blood in triage for the pt and is holding same in case it is needed.

## 2018-02-23 NOTE — Discharge Instructions (Addendum)
Use the hydroxyzine as needed for anxiety as well as insomnia.  Call the cardiology group listed in the morning to schedule outpatient follow-up for repeat evaluation of your palpitations and further testing of your heart.

## 2018-03-17 ENCOUNTER — Encounter: Payer: Self-pay | Admitting: Cardiology

## 2018-03-17 ENCOUNTER — Ambulatory Visit (INDEPENDENT_AMBULATORY_CARE_PROVIDER_SITE_OTHER): Payer: Self-pay | Admitting: Cardiology

## 2018-03-17 VITALS — BP 134/80 | HR 100 | Ht 69.0 in | Wt 262.0 lb

## 2018-03-17 DIAGNOSIS — Z6838 Body mass index (BMI) 38.0-38.9, adult: Secondary | ICD-10-CM

## 2018-03-17 DIAGNOSIS — Z79899 Other long term (current) drug therapy: Secondary | ICD-10-CM

## 2018-03-17 DIAGNOSIS — R079 Chest pain, unspecified: Secondary | ICD-10-CM

## 2018-03-17 DIAGNOSIS — Z7189 Other specified counseling: Secondary | ICD-10-CM

## 2018-03-17 DIAGNOSIS — E6609 Other obesity due to excess calories: Secondary | ICD-10-CM

## 2018-03-17 NOTE — Progress Notes (Signed)
Cardiology Office Note:    Date:  03/17/2018   ID:  Jared Hunt, DOB 1986/04/04, MRN [id number]  PCP:  Mack Hook, MD  Cardiologist:  Buford Dresser, MD PhD  Referring MD: Mack Hook, MD   CC: chest pain, prevention  History of Present Illness:    Jared Hunt is a 32 y.o. male with a hx of anxiety and depression who is seen as a new consult at the request of Mack Hook, MD for the evaluation and management of chest pain, shortness of breath, and palpitations.  Patient's concerns today: -results of CT scan, which showed reflux of contrast dye into the IVC and hepatic veins.  -hospitalized in 2017, was there for 3 days for sepsis. Was anxious at the hospital, placed on klonipin, has been on this since that time, but doesn't want to be dependent. Weaned himself down and was off for 26 days, but then his stepfather had a brain hemorrhage, and he had intense anxiety. Took one additional dose. Had trouble coming off the anxiety medication both times, had palpitations, chest tightness -former smoker (from age [age]), intermittent alcohol use. No longer using marijuana--uses CBD oil.  -he is very interested in prevention and how he can help prevent issues in the future. Has been told he has fatty liver due to poor diet in the past.   He describes his chest pain as midepigastric pain, constant, minor, like a tingling/irritation. Not exertional. He notices more when he is lying down. Does not radiate. Associated with feeling short of breath and like his heart is racing. He notes this is very similar to his anxiety attacks, and with the recent stress of his stepfather's illness, he has noticed more. He was seen in the ER for this on 02/21/18, and workup with troponins, ECG, and CTPE was unremarkable.   We spent significant time reviewing his workup and the different components of the cardiovascular system (conduction, blood vessels, valves, function, etc).  To this point everything has been normal. He was reassured by this, and we spent >40 minutes talking about prevention recommendations. All questions were answered.  FH: father's side is unknown. Mother is a smoker. Mat Gpa had stents, 4V CABG in his 19s, was a Ecologist in the past. Does have a history of high cholesterol (mother, maternal gma).  Past Medical History:  Diagnosis Date  . Anxiety   . Depression age 49 - 59  . Sepsis ([place]) 03/2016   presumed sepsis following months of respiratory symptoms felt secondary to mold in living quarters    No past surgical history on file.  Current Medications: Current Outpatient Medications on File Prior to Visit  Medication Sig  . albuterol (PROVENTIL HFA;VENTOLIN HFA) 108 (90 Base) MCG/ACT inhaler Inhale 1-2 puffs into the lungs every 6 (six) hours as needed for wheezing or shortness of breath.  Marland Kitchen aspirin 81 MG tablet Take 324-486 mg by mouth daily as needed (DVT CONCERNS).  . fexofenadine (ALLEGRA) 180 MG tablet Take 1 tablet (180 mg total) by mouth daily. (Patient not taking: Reported on 04/09/2017)  . fluticasone (FLONASE) 50 MCG/ACT nasal spray Place 2 sprays into both nostrils daily. (Patient not taking: Reported on 04/09/2017)  . hydrOXYzine (ATARAX/VISTARIL) 25 MG tablet Take 1 tablet (25 mg total) by mouth every 6 (six) hours.  . methocarbamol (ROBAXIN) 500 MG tablet Take 1 tablet (500 mg total) by mouth 3 (three) times daily between meals as needed. (Patient not taking: Reported on 04/09/2017)  . MILK THISTLE  PO Take 1 drop by mouth every other day.   . naproxen (NAPROSYN) 500 MG tablet Take 1 tablet (500 mg total) by mouth 2 (two) times daily. (Patient not taking: Reported on 04/09/2017)  . OVER THE COUNTER MEDICATION Take 1 capsule by mouth daily as needed (anxiety). *CBD Oil Capsule* takes for anxiety COMBO FLAXSEED, BORAGE OIL, FISH OIL   No current facility-administered medications on file prior to visit.      Allergies:   Sulfa  antibiotics   Social History   Socioeconomic History  . Marital status: Single    Spouse name: Not on file  . Number of children: 0  . Years of education: some college  . Highest education level: Not on file  Occupational History  . Occupation: Doctor, general practice at [employer]  . Financial resource strain: Not on file  . Food insecurity:    Worry: Not on file    Inability: Not on file  . Transportation needs:    Medical: Not on file    Non-medical: Not on file  Tobacco Use  . Smoking status: Former Smoker    Packs/day: 0.50    Years: 17.00    Pack years: 8.50    Types: Cigarettes  . Smokeless tobacco: Never Used  Substance and Sexual Activity  . Alcohol use: Yes    Comment: twice weekly  . Drug use: Yes    Types: Marijuana    Comment: MJ in teens only  . Sexual activity: Yes  Lifestyle  . Physical activity:    Days per week: Not on file    Minutes per session: Not on file  . Stress: Not on file  Relationships  . Social connections:    Talks on phone: Not on file    Gets together: Not on file    Attends religious service: Not on file    Active member of club or organization: Not on file    Attends meetings of clubs or organizations: Not on file    Relationship status: Not on file  Other Topics Concern  . Not on file  Social History Narrative   Grew up in [place] in the [employer]   Father, who is does not know well, lives here.   Has lived in [place] past 5 years.     Family History: The patient's family history includes Blindness in his sister; Hypothyroidism in his brother and sister; Osteoarthritis in his mother. There is no history of Asthma or Allergic Disorder.  ROS:   Please see the history of present illness.  Additional pertinent ROS:  Constitutional: Negative for chills, fever, night sweats, unintentional weight loss  HENT: Negative for ear pain and hearing loss.   Eyes: Negative for loss of vision and eye pain.  Respiratory: Positive  for shortness of breath when stressed. Negative for cough, sputum, wheezing.   Cardiovascular: Positive for chest tightness when stressed. Negative for palpitations , PND, orthopnea, lower extremity edema and claudication.  Gastrointestinal: Negative for abdominal pain, melena, and hematochezia.  Genitourinary: Negative for dysuria and hematuria.  Musculoskeletal: Negative for falls and myalgias.  Skin: Negative for itching and rash.  Neurological: Negative for focal weakness, focal sensory changes and loss of consciousness.  Endo/Heme/Allergies: Does not bruise/bleed easily.    EKGs/Labs/Other Studies Reviewed:    The following studies were reviewed today: ECG, labs, prior ER notes  EKG:  EKG is ordered today.  The ekg ordered today demonstrates normal sinus rhythm  Recent Labs: 04/09/2017: B Natriuretic Peptide  11.9 02/21/2018: ALT 27; BUN 12; Creatinine, Ser 0.91; Hemoglobin 15.7; Platelets 295; Potassium 3.5; Sodium 138  Recent Lipid Panel No results found for: CHOL, TRIG, HDL, CHOLHDL, VLDL, LDLCALC, LDLDIRECT  Physical Exam:    VS:  BP 134/80 (BP Location: Left Arm, Patient Position: Sitting, Cuff Size: Large)   Pulse 100   Ht 5' 9"  (1.753 m)   Wt 262 lb (118.8 kg)   BMI 38.69 kg/m     Wt Readings from Last 3 Encounters:  02/23/18 250 lb (113.4 kg)  02/21/18 250 lb (113.4 kg)  11/17/17 275 lb (124.7 kg)     GEN: Well nourished, well developed in no acute distress HEENT: Normal NECK: No JVD; No carotid bruits LYMPHATICS: No lymphadenopathy CARDIAC: regular rhythm, normal S1 and S2, no murmurs, rubs, gallops. Radial and DP pulses 2+ bilaterally. RESPIRATORY:  Clear to auscultation without rales, wheezing or rhonchi  ABDOMEN: Soft, non-tender, non-distended MUSCULOSKELETAL:  No edema; No deformity  SKIN: Warm and dry NEUROLOGIC:  Alert and oriented x 3 PSYCHIATRIC:  Normal affect   ASSESSMENT:    1. Chest pain, unspecified type   2. Medication management   3.  Counseling on health promotion and disease prevention   4. Class 2 obesity due to excess calories without serious comorbidity with body mass index (BMI) of 38.0 to 38.9 in adult    PLAN:    1. Chest pain: given his workup, normal exam, normal ECG, and overall risk, unlikely that this is cardiac in nature. No further workup required.  -he prefers to take an aspirin. Counseled on risk of bleeding, guidelines and recommendations. If he wishes to take aspirin, counseled not to take more than 81 mg daily. Adjusted in his med list.  2. Prevention: -recommend heart healthy/Mediterranean diet, with whole grains, fruits, vegetable, fish, lean meats, nuts, and olive oil. Limit salt. -recommend moderate walking, 3-5 times/week for 30-50 minutes each session. Aim for at least 150 minutes.week. Goal should be pace of 3 miles/hours, or walking 1.5 miles in 30 minutes -recommend avoidance of tobacco products. Avoid excess alcohol. -Additional risk factor control:  -Diabetes: A1c is 6.1  -Lipids: Tchol 201, LDL 126  -Blood pressure control: elevated slightly today, but not at a level that requires medication. Recommend lifestyle first.  -Weight: counseled on weight loss with lifestyle. Class 2 obesity with BMI 38.7  The ASCVD Risk score Mikey Bussing DC Jr., et al., 2013) failed to calculate for the following reasons:   The 2013 ASCVD risk score is only valid for ages 68 to 50  Lifetime ASCVD risk with the above parameters is 46%  Spent >45 minutes reviewing dietary recommendations, prevention strategies. Discussed multiple diets including Mediterranean, Whole30, keto, fasting, etc. Given the evidence (or lack thereof) that we have for each. Counseled extensively on activity as well. No restrictions from a cardiac standpoint.  Plan for follow up: 1 year  Medication Adjustments/Labs and Tests Ordered: Current medicines are reviewed at length with the patient today.  Concerns regarding medicines are outlined above.   Orders Placed This Encounter  Procedures  . Hemoglobin A1c  . Lipid panel  . EKG 12-Lead   No orders of the defined types were placed in this encounter.   Patient Instructions  Medication Instructions:  Your Physician recommend you continue on your current medication as directed.    If you need a refill on your cardiac medications before your next appointment, please call your pharmacy.   Lab work: Your physician recommends that you return  for lab work Today (Lipid, A1C)  If you have labs (blood work) drawn today and your tests are completely normal, you will receive your results only by: Marland Kitchen MyChart Message (if you have MyChart) OR . A paper copy in the mail If you have any lab test that is abnormal or we need to change your treatment, we will call you to review the results.  Testing/Procedures: None  Follow-Up: At [facility], you and your health needs are our priority.  As part of our continuing mission to provide you with exceptional heart care, we have created designated Provider Care Teams.  These Care Teams include your primary Cardiologist (physician) and Advanced Practice Providers (APPs -  Physician Assistants and Nurse Practitioners) who all work together to provide you with the care you need, when you need it. You will need a follow up appointment in 1 years.  Please call our office 2 months in advance to schedule this appointment.  You may see Dr. Harrell Gave or one of the following Advanced Practice Providers on your designated Care Team:   Rosaria Ferries, PA-C . Jory Sims, DNP, ANP  Any Other Special Instructions Will Be Listed Below (If Applicable).       Signed, Buford Dresser, MD PhD 03/17/2018 7:48 AM    [place]

## 2018-03-17 NOTE — Patient Instructions (Signed)
Medication Instructions:  Your Physician recommend you continue on your current medication as directed.    If you need a refill on your cardiac medications before your next appointment, please call your pharmacy.   Lab work: Your physician recommends that you return for lab work Today (Lipid, A1C)  If you have labs (blood work) drawn today and your tests are completely normal, you will receive your results only by: Marland Kitchen MyChart Message (if you have MyChart) OR . A paper copy in the mail If you have any lab test that is abnormal or we need to change your treatment, we will call you to review the results.  Testing/Procedures: None  Follow-Up: At [facility] you and your health needs are our priority.  As part of our continuing mission to provide you with exceptional heart care, we have created designated Provider Care Teams.  These Care Teams include your primary Cardiologist (physician) and Advanced Practice Providers (APPs -  Physician Assistants and Nurse Practitioners) who all work together to provide you with the care you need, when you need it. You will need a follow up appointment in 1 years.  Please call our office 2 months in advance to schedule this appointment.  You may see Dr. Harrell Gave or one of the following Advanced Practice Providers on your designated Care Team:   Rosaria Ferries, PA-C . Jory Sims, DNP, ANP  Any Other Special Instructions Will Be Listed Below (If Applicable).

## 2018-03-18 ENCOUNTER — Encounter (HOSPITAL_COMMUNITY): Payer: Self-pay | Admitting: Emergency Medicine

## 2018-03-18 ENCOUNTER — Encounter: Payer: Self-pay | Admitting: Cardiology

## 2018-03-18 ENCOUNTER — Ambulatory Visit: Payer: Self-pay | Admitting: Internal Medicine

## 2018-03-18 ENCOUNTER — Other Ambulatory Visit: Payer: Self-pay

## 2018-03-18 ENCOUNTER — Telehealth: Payer: Self-pay | Admitting: Cardiology

## 2018-03-18 ENCOUNTER — Emergency Department (HOSPITAL_COMMUNITY)
Admission: EM | Admit: 2018-03-18 | Discharge: 2018-03-18 | Disposition: A | Discharge status: Home / Self Care | Payer: Self-pay | Attending: Emergency Medicine | Admitting: Emergency Medicine

## 2018-03-18 ENCOUNTER — Emergency Department (HOSPITAL_COMMUNITY): Payer: Self-pay

## 2018-03-18 DIAGNOSIS — E6609 Other obesity due to excess calories: Secondary | ICD-10-CM

## 2018-03-18 DIAGNOSIS — Z87891 Personal history of nicotine dependence: Secondary | ICD-10-CM | POA: Insufficient documentation

## 2018-03-18 DIAGNOSIS — E669 Obesity, unspecified: Secondary | ICD-10-CM | POA: Insufficient documentation

## 2018-03-18 DIAGNOSIS — Z79899 Other long term (current) drug therapy: Secondary | ICD-10-CM | POA: Insufficient documentation

## 2018-03-18 DIAGNOSIS — Y939 Activity, unspecified: Secondary | ICD-10-CM | POA: Insufficient documentation

## 2018-03-18 DIAGNOSIS — W278XXA Contact with other nonpowered hand tool, initial encounter: Secondary | ICD-10-CM | POA: Insufficient documentation

## 2018-03-18 DIAGNOSIS — Z23 Encounter for immunization: Secondary | ICD-10-CM | POA: Insufficient documentation

## 2018-03-18 DIAGNOSIS — S62661A Nondisplaced fracture of distal phalanx of left index finger, initial encounter for closed fracture: Secondary | ICD-10-CM | POA: Insufficient documentation

## 2018-03-18 DIAGNOSIS — Y929 Unspecified place or not applicable: Secondary | ICD-10-CM | POA: Insufficient documentation

## 2018-03-18 DIAGNOSIS — Z6838 Body mass index (BMI) 38.0-38.9, adult: Secondary | ICD-10-CM

## 2018-03-18 DIAGNOSIS — Y999 Unspecified external cause status: Secondary | ICD-10-CM | POA: Insufficient documentation

## 2018-03-18 LAB — HEMOGLOBIN A1C
Est. average glucose Bld gHb Est-mCnc: 128 mg/dL
Hgb A1c MFr Bld: 6.1 % — ABNORMAL HIGH (ref 4.8–5.6)

## 2018-03-18 LAB — LIPID PANEL
CHOL/HDL RATIO: 4.9 ratio (ref 0.0–5.0)
Cholesterol, Total: 201 mg/dL — ABNORMAL HIGH (ref 100–199)
HDL: 41 mg/dL (ref >39)
LDL CALC: 126 mg/dL — AB (ref 0–99)
TRIGLYCERIDES: 169 mg/dL — AB (ref 0–149)
VLDL CHOLESTEROL CAL: 34 mg/dL (ref 5–40)

## 2018-03-18 MED ORDER — OXYCODONE-ACETAMINOPHEN 5-325 MG PO TABS
1.0000 | ORAL_TABLET | Freq: Once | ORAL | Status: AC
  Administered 2018-03-18: 1 via ORAL
  Filled 2018-03-18: qty 1

## 2018-03-18 MED ORDER — TETANUS-DIPHTH-ACELL PERTUSSIS 5-2.5-18.5 LF-MCG/0.5 IM SUSP
0.5000 mL | Freq: Once | INTRAMUSCULAR | Status: AC
  Administered 2018-03-18: 0.5 mL via INTRAMUSCULAR
  Filled 2018-03-18: qty 0.5

## 2018-03-18 MED ORDER — ONDANSETRON 4 MG PO TBDP
4.0000 mg | ORAL_TABLET | Freq: Once | ORAL | Status: AC
  Administered 2018-03-18: 4 mg via ORAL
  Filled 2018-03-18: qty 1

## 2018-03-18 NOTE — Telephone Encounter (Signed)
New Message       Patient returned your call, would like a call back.

## 2018-03-18 NOTE — ED Provider Notes (Signed)
[facility] EMERGENCY DEPARTMENT Provider Note  CSN: [id number] Arrival date & time: 03/18/18  1203   History   Chief Complaint Chief Complaint  Patient presents with  . Finger Injury    HPI Jared Hunt is a 32 y.o. male who presented to the ED for   Hand Injury   The incident occurred 1 to 2 hours ago. The incident occurred at work. The injury mechanism was a direct blow (Sledgehammer hit his left 2nd digit). The pain is present in the left fingers. The quality of the pain is described as throbbing. The pain is at a severity of 8/10. The pain is severe. The pain has been constant since the incident. Associated symptoms comments: Denies paresthesias, weakness, decreased ROM, color/temperature change in the digit. He reports no foreign bodies present. The symptoms are aggravated by palpation and movement. He has tried nothing for the symptoms.    Past Medical History:  Diagnosis Date  . Anxiety   . Depression age 89 - 64  . Sepsis ([place]) 03/2016   presumed sepsis following months of respiratory symptoms felt secondary to mold in living quarters    Patient Active Problem List   Diagnosis Date Noted  . Class 2 obesity due to excess calories without serious comorbidity with body mass index (BMI) of 38.0 to 38.9 in adult 03/18/2018  . Depression   . Tattoo   . Former smoker   . Wheezing   . Acute bronchitis   . Lactic acidosis   . Onychomycosis   . Sepsis ([place]) 04/15/2016  . Hypokalemia 04/15/2016    History reviewed. No pertinent surgical history.      Home Medications    Prior to Admission medications   Medication Sig Start Date End Date Taking? Authorizing Provider  albuterol (PROVENTIL HFA;VENTOLIN HFA) 108 (90 Base) MCG/ACT inhaler Inhale 1-2 puffs into the lungs every 6 (six) hours as needed for wheezing or shortness of breath.    [provider]  aspirin 81 MG tablet Take 81 mg by mouth daily as needed (DVT CONCERNS).    [provider]  fexofenadine (ALLEGRA) 180 MG tablet Take 1 tablet (180 mg total) by mouth daily. 09/18/16   Mack Hook, MD  MILK THISTLE PO Take 1 drop by mouth every other day.     [provider]  OVER THE COUNTER MEDICATION Take 1 capsule by mouth daily as needed (anxiety). *CBD Oil Capsule* takes for anxiety COMBO FLAXSEED, BORAGE OIL, FISH OIL    [provider]    Family History Family History  Problem Relation Age of Onset  . Osteoarthritis Mother   . Blindness Sister   . Hypothyroidism Brother   . Hypothyroidism Sister   . Asthma Neg Hx   . Allergic Disorder Neg Hx     Social History Social History   Tobacco Use  . Smoking status: Former Smoker    Packs/day: 0.50    Years: 17.00    Pack years: 8.50    Types: Cigarettes  . Smokeless tobacco: Never Used  Substance Use Topics  . Alcohol use: Yes    Comment: twice weekly  . Drug use: Yes    Types: Marijuana    Comment: MJ in teens only     Allergies   Sulfa antibiotics   Review of Systems Review of Systems  Musculoskeletal: Positive for arthralgias.  Skin: Positive for wound. Negative for color change and pallor.  Neurological: Negative for weakness and numbness.  Hematological:  Does not bruise/bleed easily.  All other systems reviewed and are negative.  Physical Exam Updated Vital Signs BP 138/85 (BP Location: Right Arm)   Pulse 76   Temp 98.4 F (36.9 C) (Oral)   Resp 20   Ht 5' 9"  (1.753 m)   Wt 117.9 kg   SpO2 98%   BMI 38.40 kg/m   Physical Exam  Constitutional: He appears well-developed and well-nourished.  Cardiovascular:  Pulses:      Radial pulses are 2+ on the right side, and 2+ on the left side.  Musculoskeletal:  Full ROM of wrists and digits bilaterally with 5/5 strength. Grip normal and intact. Tenderness over the distal aspect of the left 2nd digit. Collection of blood seen under nail bed. No lacerations to nail bed or finger appreciated.  Skin: Skin is  warm and intact. Capillary refill takes less than 2 seconds. Bruising noted. No pallor.  Nursing note and vitals reviewed.  ED Treatments / Results  Labs (all labs ordered are listed, but only abnormal results are displayed) Labs Reviewed - No data to display  EKG None  Radiology Dg Hand Complete Left  Result Date: 03/18/2018 CLINICAL DATA:  Injury left index finger.  Hematoma and laceration. EXAM: LEFT HAND - COMPLETE 3+ VIEW COMPARISON:  No recent. FINDINGS: Comminuted fracture of the distal tuft of the left second digit. No other focal bony abnormality. No radiopaque foreign body. IMPRESSION: Comminuted fracture of the distal tuft of the left second digit. Electronically Signed   By: Marcello Moores  Register   On: 03/18/2018 13:17    Procedures Procedures (including critical care time)  Medications Ordered in ED Medications  Tdap (BOOSTRIX) injection 0.5 mL (0.5 mLs Intramuscular Given 03/18/18 1241)  oxyCODONE-acetaminophen (PERCOCET/ROXICET) 5-325 MG per tablet 1 tablet (1 tablet Oral Given 03/18/18 1335)  ondansetron (ZOFRAN-ODT) disintegrating tablet 4 mg (4 mg Oral Given 03/18/18 1335)   Initial Impression / Assessment and Plan / ED Course  Triage vital signs and the nursing notes have been reviewed.  Pertinent labs & imaging results that were available during care of the patient were reviewed and considered in medical decision making (see chart for details).  Patient is in no distress and well appearing. He presents for left finger pain following it being hit with a sledgehammer. Patient has full sensation in left hand and digits with full active and passive ROM. There is swelling, bruising and subungual hematoma to the affected digit. No deformities, decreased muscle tone or other abnormalities visualized. Neurovascular function is intact. Physical exam overall reassuring, but will obtain imaging to evaluate for possible fracture given the extent of the trauma. There are no other  physical exam findings or s/s that suggest an underlying infectious or rheumatologic process that warrant further evaluation or intervention today.  Clinical Course as of Mar 19 1435  Fri Mar 18, 2018  1431 Fracture of distal phalanx on left 2nd digit. Non-displaced.   [GM]    Clinical Course User Index [GM] Lolly Glaus, Jonelle Sports, PA-C    Final Clinical Impressions(s) / ED Diagnoses  1. Distal Phalanx Fracture. Left 2nd Digit. Finger splint applied. No lacerations or wounds that require repair. Education provided on OTC and supportive treatment for pain relief. Advised to follow-up with hand surgeon.  Dispo: Home. After thorough clinical evaluation, this patient is determined to be medically stable and can be safely discharged with the previously mentioned treatment and/or outpatient follow-up/referral(s). At this time, there are no other apparent medical conditions that require further screening,  evaluation or treatment.   Final diagnoses:  Closed nondisplaced fracture of distal phalanx of left index finger, initial encounter    ED Discharge Orders    None        Junita Push 03/18/18 1436    Elnora Morrison, MD 03/18/18 1715

## 2018-03-18 NOTE — Telephone Encounter (Signed)
Spoke with pt. Pt aware of lab results with verbal understanding.  Notes recorded by Buford Dresser, MD on 03/18/2018 at 10:55 AM EDT You A1c is a bit elevated--6.5 is the cutoff for a diagnosis of diabetes, and you are at 6.1. So, not yet diabetic but headed in that direction. This can be improved with some of the lifestyle changes we discussed.  Your cholesterol is also a bit higher than ideal. Total cholesterol is 201, and LDL (bad cholesterol) is 126. These are not so high that we have to rush and do something right now, but it will be good to recheck them next year after the lifestyle changes you plan and see how they look.

## 2018-03-18 NOTE — ED Notes (Signed)
Patient transported to X-ray 

## 2018-03-18 NOTE — ED Triage Notes (Signed)
Pt hit great finger with sledghammer when chopping wood. He has hematoma under nail bed and small lac to finger tip. Unknown tetanus.

## 2018-03-18 NOTE — Discharge Instructions (Addendum)
The end of your finger is broken. Please follow-up with the hand specialist that I have listed below.   Keep the finger splint on all the time except for bathing and cleaning your nail. You may have a little blood drain from the holes drilled in your nail. For pain, you may use Tylenol and/or Ibuprofen.  Thank you for allowing [place] to take care of you today. Good luck with your album!

## 2018-03-18 NOTE — ED Notes (Signed)
Soaking pt's finger in normal saline with hydrogen peroxide, pt made aware and tolerated well.

## 2018-03-22 ENCOUNTER — Ambulatory Visit: Payer: Self-pay | Admitting: Internal Medicine

## 2018-05-12 ENCOUNTER — Encounter (HOSPITAL_COMMUNITY): Payer: Self-pay

## 2018-05-12 ENCOUNTER — Other Ambulatory Visit: Payer: Self-pay

## 2018-05-12 ENCOUNTER — Emergency Department (HOSPITAL_COMMUNITY)
Admission: EM | Admit: 2018-05-12 | Discharge: 2018-05-13 | Discharge status: Against Medical Advice | Payer: Self-pay | Attending: Emergency Medicine | Admitting: Emergency Medicine

## 2018-05-12 DIAGNOSIS — Z5321 Procedure and treatment not carried out due to patient leaving prior to being seen by health care provider: Secondary | ICD-10-CM | POA: Insufficient documentation

## 2018-05-12 LAB — CBC
HEMATOCRIT: 46.2 % (ref 39.0–52.0)
Hemoglobin: 15.4 g/dL (ref 13.0–17.0)
MCH: 28.7 pg (ref 26.0–34.0)
MCHC: 33.3 g/dL (ref 30.0–36.0)
MCV: 86.2 fL (ref 80.0–100.0)
Platelets: 324 10*3/uL (ref 150–400)
RBC: 5.36 MIL/uL (ref 4.22–5.81)
RDW: 12.4 % (ref 11.5–15.5)
WBC: 9.8 10*3/uL (ref 4.0–10.5)
nRBC: 0 % (ref 0.0–0.2)

## 2018-05-12 LAB — BASIC METABOLIC PANEL
Anion gap: 14 (ref 5–15)
BUN: 8 mg/dL (ref 6–20)
CHLORIDE: 101 mmol/L (ref 98–111)
CO2: 24 mmol/L (ref 22–32)
CREATININE: 0.94 mg/dL (ref 0.61–1.24)
Calcium: 9.3 mg/dL (ref 8.9–10.3)
GFR calc Af Amer: >60 mL/min (ref >60)
GFR calc non Af Amer: >60 mL/min (ref >60)
GLUCOSE: 104 mg/dL — AB (ref 70–99)
Potassium: 3.9 mmol/L (ref 3.5–5.1)
Sodium: 139 mmol/L (ref 135–145)

## 2018-05-12 LAB — URINALYSIS, ROUTINE W REFLEX MICROSCOPIC
BILIRUBIN URINE: NEGATIVE
GLUCOSE, UA: NEGATIVE mg/dL
Hgb urine dipstick: NEGATIVE
KETONES UR: NEGATIVE mg/dL
Leukocytes, UA: NEGATIVE
Nitrite: NEGATIVE
PH: 6 (ref 5.0–8.0)
Protein, ur: NEGATIVE mg/dL
Specific Gravity, Urine: 1.01 (ref 1.005–1.030)

## 2018-05-12 NOTE — ED Triage Notes (Signed)
Patient here due to feeling week and just not right over the last week.  Anxious because he has a rough time with the holidays and was hospitalized with sepsis in the past.  Afebrile and not tachycardic at this time.  No complaints of pain.

## 2018-05-12 NOTE — ED Notes (Signed)
No answer when called in waiting room.

## 2018-05-13 NOTE — ED Notes (Signed)
Pt called for treatment room x 3, no response.

## 2018-05-13 NOTE — ED Notes (Signed)
Pt called for room, no response. Kory(EMT)

## 2018-05-26 ENCOUNTER — Encounter: Payer: Self-pay | Admitting: Pulmonary Disease

## 2018-05-26 ENCOUNTER — Ambulatory Visit (INDEPENDENT_AMBULATORY_CARE_PROVIDER_SITE_OTHER): Payer: Self-pay | Admitting: Pulmonary Disease

## 2018-05-26 VITALS — BP 124/84 | HR 90 | Ht 69.0 in | Wt 245.0 lb

## 2018-05-26 DIAGNOSIS — R062 Wheezing: Secondary | ICD-10-CM

## 2018-05-26 DIAGNOSIS — R079 Chest pain, unspecified: Secondary | ICD-10-CM | POA: Insufficient documentation

## 2018-05-26 NOTE — Patient Instructions (Signed)
CT scan and chest x-ray shows structurally normal lungs Lung function appears normal

## 2018-05-26 NOTE — Progress Notes (Signed)
Subjective:    Patient ID: Jared Hunt, male    DOB: [birth year], 33 y.o.   MRN: [id number]  HPI  Chief Complaint  Patient presents with  . Pulm Consult    Self referral for exposure to black mold several years ago. Smoked for 18 years. Has some chest discomfort on the right side.     33 year old ex-smoker presents for evaluation of recurrent chest pain.  He admits to severe anxiety after hospitalization in 2017 and would like to get his "lungs checked out". He smoked about a pack per day starting at age 11 up to 33 years old, less than 20 pack years.  He admits to using drugs and now only smokes marijuana occasionally  He was admitted 03/2016, I have reviewed his record, presumptive diagnosis was sepsis but no clear etiology was found, lactate was high, he reported exposure to mold from his new apartment, CT angiogram was negative for pulmonary embolism or lung infiltrates, cultures were negative.  Since this hospitalization he has had severe anxiety and several episodes of chest pain.  I see several visits to the emergency room. Evaluation has included- 10/2017 ultrasound abdomen negative for right upper quadrant pain Nuclear imaging negative for gallbladder etiology.  Clear chest x-ray 01/2018 CT angiogram 01/2018 did not show any evidence of bone embolism showed reflux of contrast into IVC and hepatic veins suggestive of increased RV pressure. He then saw cardiologist and was reassured. He has a diagnosis of fatty liver and has decreased his alcohol intake. He was given albuterol MDI for wheezing but has really not use this in the past year  He reports chest pain in both upper lobes below the clavicles and wonders if this could be related to smoking marijuana.  He denies fevers, recurrent chest colds, leg swelling or pedal edema.  He denies shortness of breath and would like to start an exercise program & yogic  breathing techniques.  We reviewed imaging studies in  detail Spirometry shows no evidence of airway obstruction with FEV1 of 94% and FVC of 84%    Past Medical History:  Diagnosis Date  . Anxiety   . Depression age 40 - 44  . Sepsis ([place]) 03/2016   presumed sepsis following months of respiratory symptoms felt secondary to mold in living quarters    No past surgical history on file.  Allergies  Allergen Reactions  . Sulfa Antibiotics Anaphylaxis    Social History   Socioeconomic History  . Marital status: Single    Spouse name: Not on file  . Number of children: 0  . Years of education: some college  . Highest education level: Not on file  Occupational History  . Occupation: Doctor, general practice at [employer]  . Financial resource strain: Not on file  . Food insecurity:    Worry: Not on file    Inability: Not on file  . Transportation needs:    Medical: Not on file    Non-medical: Not on file  Tobacco Use  . Smoking status: Former Smoker    Packs/day: 0.50    Years: 17.00    Pack years: 8.50    Types: Cigarettes  . Smokeless tobacco: Never Used  Substance and Sexual Activity  . Alcohol use: Yes    Comment: twice weekly  . Drug use: Yes    Types: Marijuana    Comment: MJ in teens only  . Sexual activity: Yes  Lifestyle  . Physical activity:    Days per  week: Not on file    Minutes per session: Not on file  . Stress: Not on file  Relationships  . Social connections:    Talks on phone: Not on file    Gets together: Not on file    Attends religious service: Not on file    Active member of club or organization: Not on file    Attends meetings of clubs or organizations: Not on file    Relationship status: Not on file  . Intimate partner violence:    Fear of current or ex partner: Not on file    Emotionally abused: Not on file    Physically abused: Not on file    Forced sexual activity: Not on file  Other Topics Concern  . Not on file  Social History Narrative   Grew up in [place] the [place]    Father, who is does not know well, lives here.   Has lived in [place] past 5 years.     Family History  Problem Relation Age of Onset  . Osteoarthritis Mother   . Blindness Sister   . Hypothyroidism Brother   . Hypothyroidism Sister   . Asthma Neg Hx   . Allergic Disorder Neg Hx      Review of Systems  Constitutional: Negative for fever and unexpected weight change.  HENT: Negative for congestion, dental problem, ear pain, nosebleeds, postnasal drip, rhinorrhea, sinus pressure, sneezing, sore throat and trouble swallowing.   Eyes: Negative for redness and itching.  Respiratory: Positive for cough and shortness of breath. Negative for chest tightness and wheezing.   Cardiovascular: Negative for palpitations and leg swelling.  Gastrointestinal: Positive for abdominal pain. Negative for nausea and vomiting.  Genitourinary: Negative for dysuria.  Musculoskeletal: Negative for joint swelling.  Skin: Negative for rash.  Allergic/Immunologic: Negative.  Negative for environmental allergies, food allergies and immunocompromised state.  Neurological: Negative for headaches.  Hematological: Does not bruise/bleed easily.  Psychiatric/Behavioral: Negative for dysphoric mood. The patient is nervous/anxious.        Objective:   Physical Exam  Gen. Pleasant, obese, in no distress, normal affect ENT - no pallor,icterus, no post nasal drip, class 2 airway, long uvula Neck: No JVD, no thyromegaly, no carotid bruits Lungs: no use of accessory muscles, no dullness to percussion, decreased without rales or rhonchi  Cardiovascular: Rhythm regular, heart sounds  normal, no murmurs or gallops, no peripheral edema Abdomen: soft and non-tender, no hepatosplenomegaly, BS normal. Musculoskeletal: No deformities, no cyanosis or clubbing Neuro:  alert, non focal, no tremors       Assessment & Plan:

## 2018-05-26 NOTE — Assessment & Plan Note (Signed)
I provided him reassurance that his lungs were structurally and functionally normal since he had normal spirometry.  Do not feel the need for bronchodilators since there is no evidence of airway obstruction. We discussed dangers of smoking cigarettes or vaping and effects of marijuana on the lung. I reassured him that he can start an exercise program and practice of pranayama   His chest pain does not seem to be due to pleurisy or pericarditis.  I agree that there is quite unlikely that he has pulmonary hypertension but if his symptoms persist would consider getting an echocardiogram

## 2018-11-16 IMAGING — DX DG HAND COMPLETE 3+V*L*
3 series · 3 of 3 positions shown · non-contrast
Comparison: No recent.

CLINICAL DATA: Injury left index finger.  Hematoma and laceration.

EXAM:
LEFT HAND - COMPLETE 3+ VIEW

[hand pa]
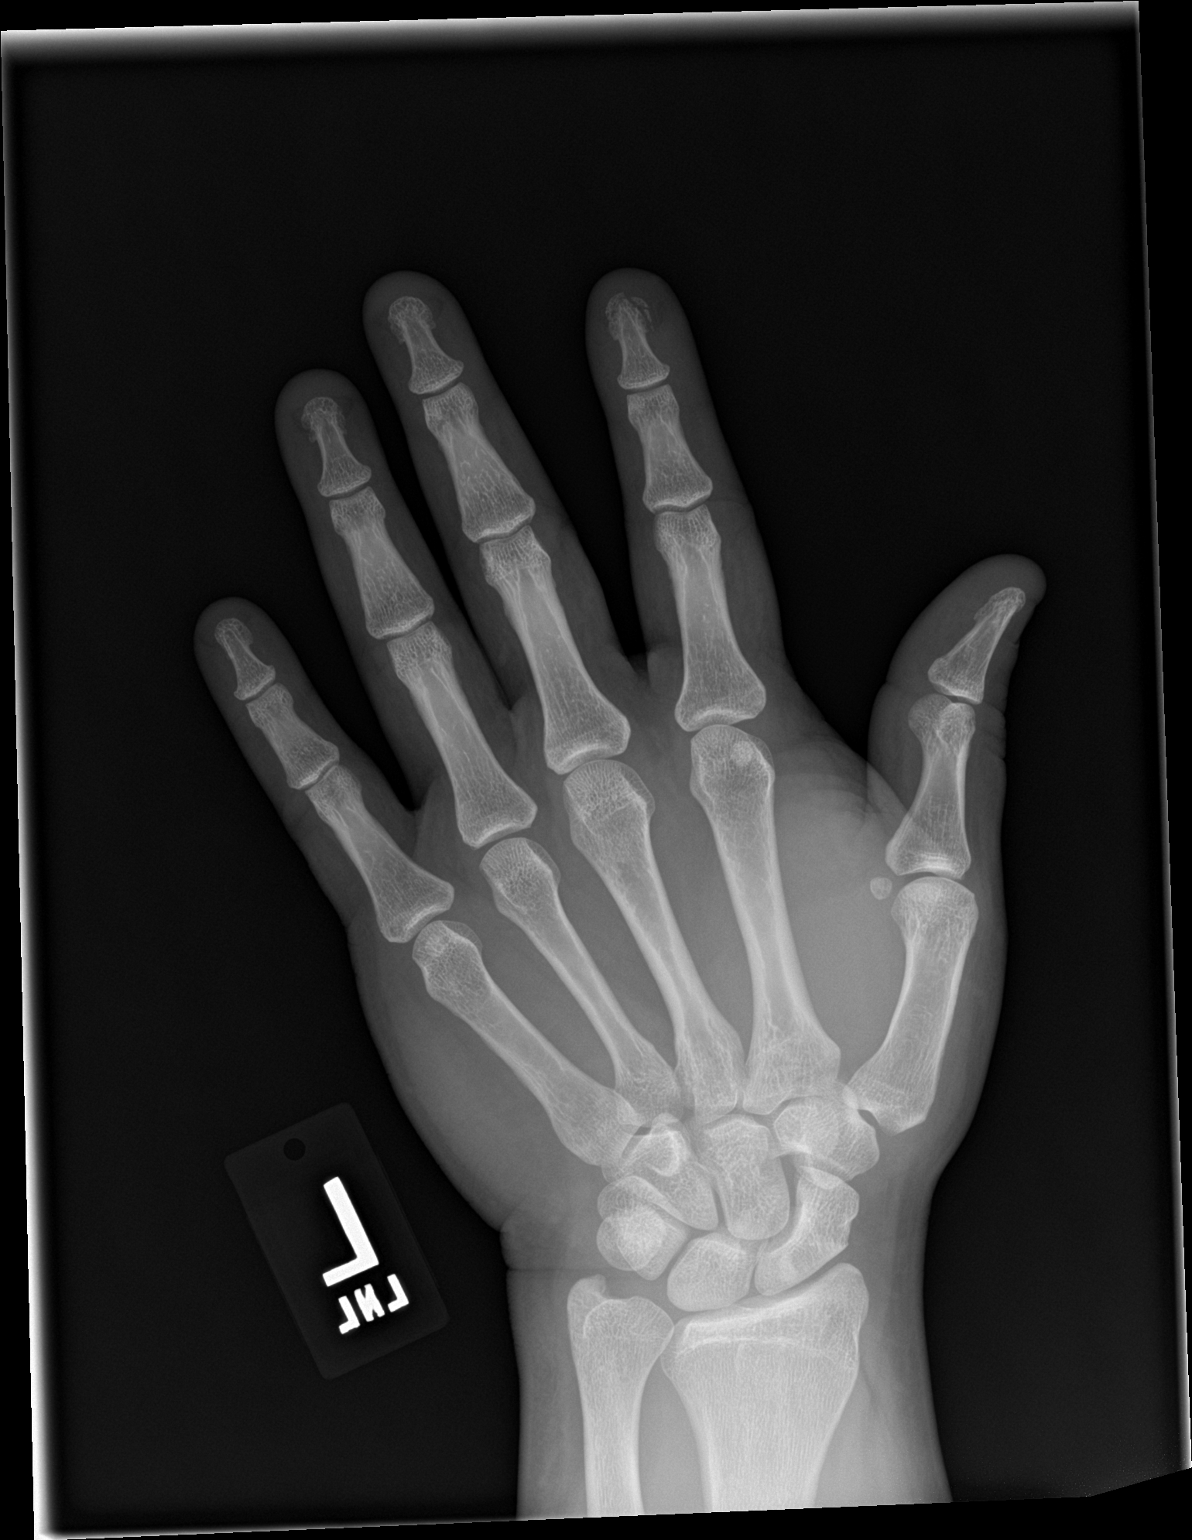

[hand obl]
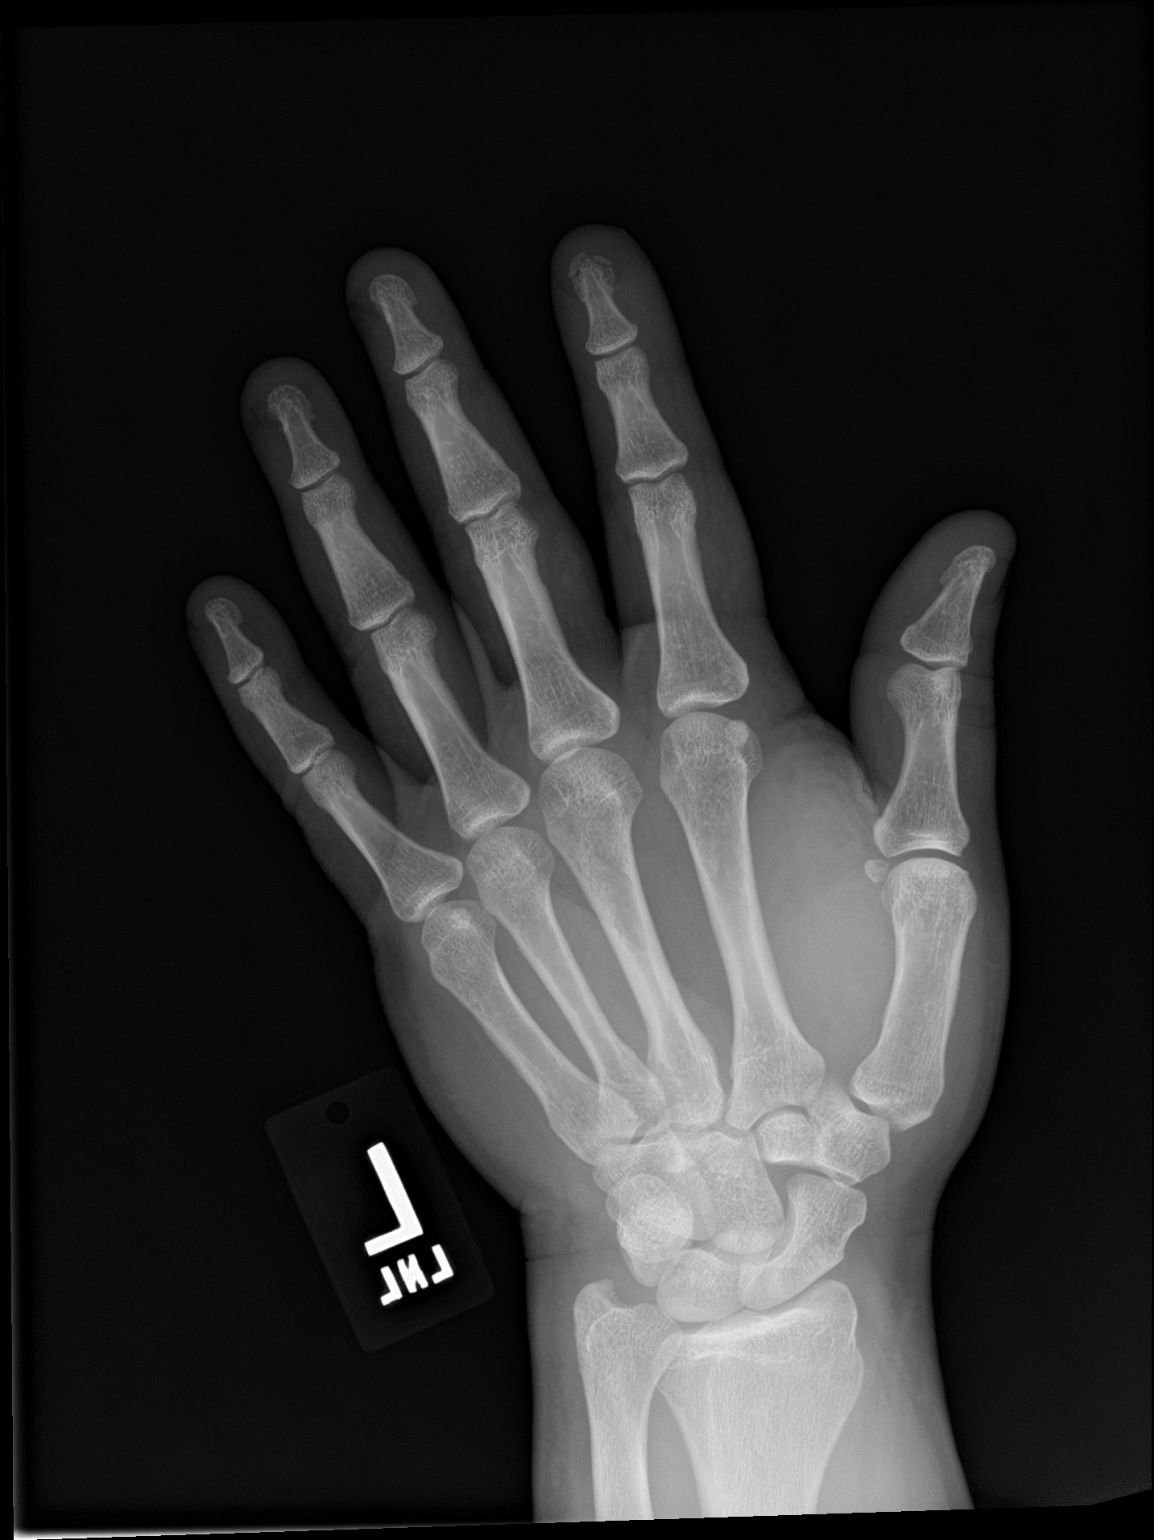

[hand lat]
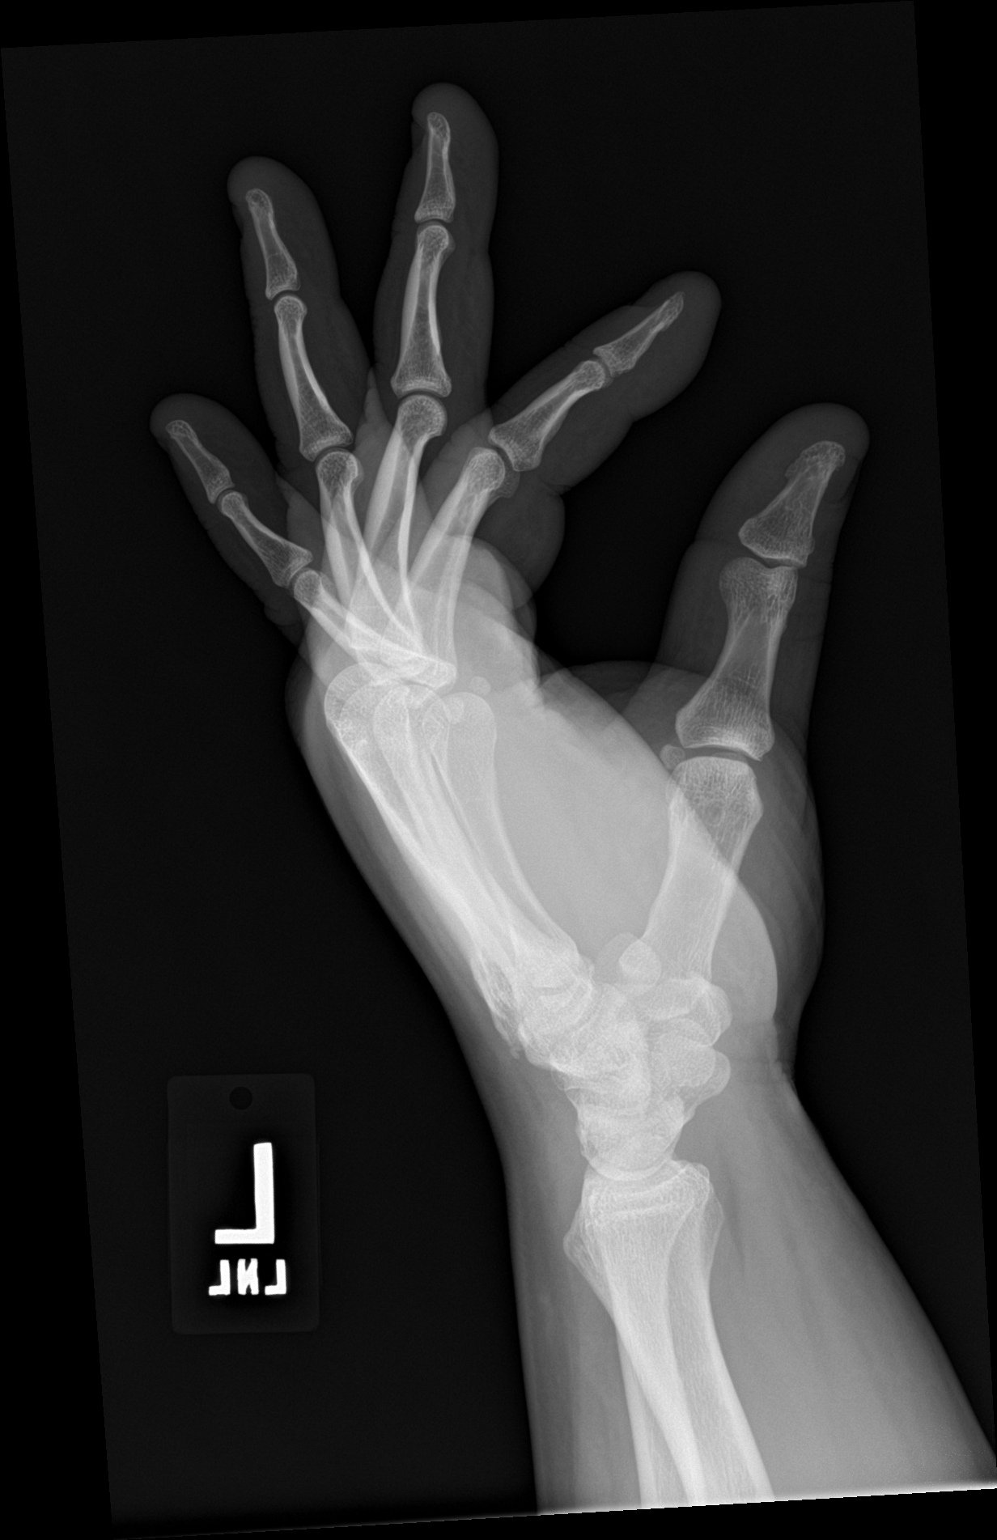

[3 of 3 positions shown; findings below may reference images not displayed]

FINDINGS: Comminuted fracture of the distal tuft of the left second digit. No
other focal bony abnormality. No radiopaque foreign body.
IMPRESSION: Comminuted fracture of the distal tuft of the left second digit.

## 2018-12-02 ENCOUNTER — Ambulatory Visit: Payer: Self-pay | Admitting: Allergy

## 2018-12-02 ENCOUNTER — Other Ambulatory Visit: Payer: Self-pay

## 2018-12-02 ENCOUNTER — Encounter: Payer: Self-pay | Admitting: Allergy

## 2018-12-02 VITALS — BP 122/86 | HR 76 | Temp 97.1°F | Resp 16

## 2018-12-02 DIAGNOSIS — J3089 Other allergic rhinitis: Secondary | ICD-10-CM

## 2018-12-02 DIAGNOSIS — L508 Other urticaria: Secondary | ICD-10-CM

## 2018-12-02 MED ORDER — EPINEPHRINE 0.3 MG/0.3ML IJ SOAJ: 0.3000 mg | Freq: Once | INTRAMUSCULAR | 1 refills | Status: AC

## 2018-12-02 NOTE — Progress Notes (Signed)
New Patient Note  RE: Jared Hunt MRN: [id number] DOB: 05/14/1986 Date of Office Visit: 12/02/2018  Referring provider: Mack Hook, MD Primary care provider: Mack Hook, MD  Chief Complaint: Hives  History of present illness: Jared Hunt is a 33 y.o. male presenting today for evaluation of hives that occurred 2 hours after eating lunch. He reports, that on Tuesday of this week, he ate lunch which consisted of macaroni and cheese, beef, pepper jack cheese, pickled jalapeno, apple wood bacon, chipotle aioli, lettuce, tomato, onion, tomato, watermelon, and water and developed scattered hives about 2 hours after eating. He reports at that time he took Benadryl with full relief of hives within 20-30 minutes. He did not experience cardiopulmonary or gastrointestinal symptoms with the hives. He has not had hives before this occasion.  He has been recently bitten by a tick.  He reports that he experiences nasal congestion and sneezing while drinking different types of alcohol including wine, beer, and spirits.  He reports occasional clear rhinorrhea when he is around mold or water and in the fall season.  He denies history of eczema.  He reports hospital admission in 2017 for septic chest infection.  He reports living in an apartment with black mold at that time and having frequent exposure which he believes caused his chest infection.  He began smoking at age 85 and quit at age 26.  He has had a full pulmonary work-up including PFTs in January 2020 in which his lung function was found to be structurally and functionally normal.  His current medications are listed in the chart  Review of systems: Review of Systems  Constitutional: Negative.   HENT: Negative.   Eyes: Negative.   Respiratory: Negative.   Cardiovascular: Negative.   Gastrointestinal: Negative.   Genitourinary: Negative.   Musculoskeletal: Negative.   Skin: Positive for itching and rash.       On  Tuesday after eating lunch  Neurological: Negative.   Endo/Heme/Allergies: Negative.   Psychiatric/Behavioral: Negative.   All other systems reviewed and are negative.   All other systems negative unless noted above in HPI  Past medical history: Past Medical History:  Diagnosis Date  . Anxiety   . Depression age 49 - 33  . Fatty liver   . Sepsis ([place]) 03/2016   presumed sepsis following months of respiratory symptoms felt secondary to mold in living quarters    Past surgical history: History reviewed. No pertinent surgical history.  Family history:  Family History  Problem Relation Age of Onset  . Osteoarthritis Mother   . Blindness Sister   . Hypothyroidism Brother   . Hypothyroidism Sister   . Asthma Neg Hx   . Allergic Disorder Neg Hx     Social history:  Environmental History: Jared Hunt lives in an apartment that is greater than 37 years old.  There is concern for water or mildew damage in the home.  The flooring is wooden throughout.  The heat is electric and cooling his window.  There is a small short hair dog located in the house and neighbor cats located outside the house.  The dog sleeps in his bedroom.  There is no concern for roaches in the home.  He is not currently using dust mite free covers for the bed or pillow.  There is no concern for tobacco smoke in the house or the car.  His hobby is running and he reports that he is exposed to fumes, chemicals, and dust while he  is running.  He was a IT consultant.  He is a former smoker from age [age] at 1 pack a day.  Medication List: Allergies as of 12/02/2018      Reactions   Sulfa Antibiotics Anaphylaxis      Medication List       Accurate as of December 02, 2018  4:50 PM. If you have any questions, ask your nurse or doctor.        EPINEPHrine 0.3 mg/0.3 mL Soaj injection Commonly known as: EpiPen 2-Pak Inject 0.3 mLs (0.3 mg total) into the muscle once for 1 dose. Started by: Shaylar Charmian Muff, MD    fexofenadine 180 MG tablet Commonly known as: ALLEGRA Take 180 mg by mouth daily as needed for allergies or rhinitis.   MILK THISTLE PO Take 1 drop by mouth every other day.   OVER THE COUNTER MEDICATION Take 1 capsule by mouth daily as needed (anxiety). *CBD Oil Capsule* takes for anxiety COMBO FLAXSEED, BORAGE OIL, FISH OIL       Known medication allergies: Allergies  Allergen Reactions  . Sulfa Antibiotics Anaphylaxis     Physical examination: Blood pressure 122/86, pulse 76, temperature (!) 97.1 F (36.2 C), temperature source Oral, resp. rate 16, SpO2 96 %.  General: Alert, interactive, in no acute distress. HEENT: TMs pearly gray, turbinates mildly edematous without discharge, post-pharynx unremarkable. Neck: Supple without lymphadenopathy. Lungs: Clear to auscultation without wheezing, rhonchi or rales. {no increased work of breathing. CV: Normal S1, S2 without murmurs. Abdomen: Nondistended, nontender. Skin: Warm and dry, without lesions or rashes. Extremities:  No clubbing, cyanosis or edema. Neuro:   Grossly intact.  Diagnositics/Labs: Labs: Collected today: alpha gal, zone 2, and food panel  Allergy testing: Adult foods: Negative with adequate controls. Allergy testing results were read and interpreted by provider, documented by clinical staff.   Assessment and plan:   Food allergy Your food skin testing today was negative to all foods we tested Begin to avoid the foods you ate on Tuesday, including macaroni and cheese, beef, pepper jack cheese, pickled jalapeno, apple wood bacon, chipotle aioli, lettuce, tomato, onion, tomato, watermelon, . In case of an allergic reaction, give Benadryl 50 mg every 4 hours, and if life-threatening symptoms occur, inject with EpiPen 0.3 mg. For itch begin cetirizine 10 mg once a day and famotidine 20 mg once a day. If itch does not resolve increase to famotidine 20 mg twice a day We will collect some labs to help evaluate  your food allergies further.  If your symptoms re-occur, begin a journal of events that occurred for up to 6 hours before your symptoms began including foods and beverages consumed, soaps or perfumes you had contact with, and medications.    Allergic rhinitis  - will obtain environmental allergy panel  Call the clinic if this treatment plan is not working well for you  Follow up in 2 months or sooner if needed  Thank you for the opportunity to care for this patient.  Please do not hesitate to contact me with questions.  Gareth Morgan, FNP [facility]  Attestation:  I performed and discussed the history and physical examination of the patient as well as management with NP Francile Woolford. I reviewed the NP's note and agree with the documented findings and plan of care with following additions/exceptions:  Recent onset of hives after meal and history of tick bite.  Will need to rule out Alpha gal allergy  as his meal did consist of red meat products.  Food allergy skin testing today was negative with an appropriately positive histamine control.  Will obtain serum IgE levels to the foods he ate prior to onset of urticaria.  He has been asked to avoid these foods until labs return.  He will be provided with an epinephrine device.  He has been asked to keep a journal if hives were to return again.     I appreciate the opportunity to take part in Jared Hunt's care. Please do not hesitate to contact me with questions.    Sincerely,   Prudy Feeler, MD Allergy/Immunology [facility]

## 2018-12-02 NOTE — Patient Instructions (Addendum)
Food allergy Your food skin testing today was negative to all foods we tested Begin to avoid the foods you ate on Tuesday, including macaroni and cheese, beef, pepper jack cheese, pickled jalapeno, apple wood bacon, chipotle aioli, lettuce, tomato, onion, tomato, watermelon, . In case of an allergic reaction, give Benadryl 50 mg every 4 hours, and if life-threatening symptoms occur, inject with EpiPen 0.3 mg. For itch begin cetirizine 10 mg once a day and famotidine 20 mg once a day. If itch does not resolve increase to famotidine 20 mg twice a day We will collect some labs to help evaluate your food allergies further.  If your symptoms re-occur, begin a journal of events that occurred for up to 6 hours before your symptoms began including foods and beverages consumed, soaps or perfumes you had contact with, and medications.   Allergic rhinitis  - will obtain environmental allergy panel  Call the clinic if this treatment plan is not working well for you  Follow up in 2 months or sooner if needed

## 2018-12-07 LAB — ALLERGENS, ZONE 2

## 2018-12-07 LAB — SPECIMEN STATUS REPORT

## 2018-12-09 ENCOUNTER — Telehealth: Payer: Self-pay | Admitting: Allergy

## 2018-12-09 NOTE — Telephone Encounter (Signed)
Advise required

## 2018-12-09 NOTE — Telephone Encounter (Signed)
Forwarding to [employer].

## 2018-12-09 NOTE — Telephone Encounter (Signed)
Patient is calling for test results.

## 2018-12-09 NOTE — Telephone Encounter (Signed)
Dr. Nelva Bush Please advise on lab results

## 2018-12-09 NOTE — Telephone Encounter (Signed)
Patient notified of available results and notified that some results are still pending

## 2018-12-10 LAB — ALLERGEN PROFILE, BASIC FOOD
Allergen Corn, IgE: <0.1 kU/L
Beef IgE: 1.17 kU/L — AB
Chocolate/Cacao IgE: <0.1 kU/L
Egg, Whole IgE: <0.1 kU/L
Food Mix (Seafoods) IgE: NEGATIVE
Milk IgE: 0.45 kU/L — AB
Peanut IgE: <0.1 kU/L
Pork IgE: 0.84 kU/L — AB
Soybean IgE: <0.1 kU/L
Wheat IgE: <0.1 kU/L

## 2018-12-10 LAB — ALLERGENS W/TOTAL IGE AREA 2
Alternaria Alternata IgE: <0.1 kU/L
Aspergillus Fumigatus IgE: <0.1 kU/L
Bermuda Grass IgE: <0.1 kU/L
Cat Dander IgE: 0.25 kU/L — AB
Cedar, Mountain IgE: <0.1 kU/L
Cladosporium Herbarum IgE: <0.1 kU/L
Cockroach, German IgE: <0.1 kU/L
Common Silver Birch IgE: <0.1 kU/L
Cottonwood IgE: <0.1 kU/L
D Farinae IgE: <0.1 kU/L
D Pteronyssinus IgE: <0.1 kU/L
Dog Dander IgE: 0.28 kU/L — AB
Elm, American IgE: <0.1 kU/L
IgE (Immunoglobulin E), Serum: 100 IU/mL (ref 6–495)
Johnson Grass IgE: <0.1 kU/L
Maple/Box Elder IgE: <0.1 kU/L
Mouse Urine IgE: <0.1 kU/L
Oak, White IgE: <0.1 kU/L
Pecan, Hickory IgE: <0.1 kU/L
Penicillium Chrysogen IgE: <0.1 kU/L
Pigweed, Rough IgE: <0.1 kU/L
Ragweed, Short IgE: <0.1 kU/L
Sheep Sorrel IgE Qn: <0.1 kU/L
Timothy Grass IgE: <0.1 kU/L
White Mulberry IgE: <0.1 kU/L

## 2018-12-10 LAB — ALLERGEN, ONION, F48: Allergen Onion IgE: <0.1 kU/L

## 2018-12-10 LAB — ALPHA-GAL PANEL
Alpha Gal IgE*: 1.19 kU/L — ABNORMAL HIGH (ref <0.10)
Beef (Bos spp) IgE: 1.13 kU/L — ABNORMAL HIGH (ref <0.35)
Class Interpretation: 2
Class Interpretation: 2
Class Interpretation: 2
Lamb/Mutton (Ovis spp) IgE: 0.72 kU/L — ABNORMAL HIGH (ref <0.35)
Pork (Sus spp) IgE: 0.89 kU/L — ABNORMAL HIGH (ref <0.35)

## 2018-12-10 LAB — ALLERGEN, TOMATO F25: Allergen Tomato, IgE: <0.1 kU/L

## 2018-12-10 LAB — JALAPENO PEPPER, IGE
Class Interpretation: 0
Jalapeno Pepper: <0.35 kU/L (ref <0.35)

## 2018-12-10 LAB — ALLERGEN WATERMELON: Allergen Watermelon IgE: <0.1 kU/L

## 2018-12-10 LAB — TRYPTASE: Tryptase: 4.5 ug/L (ref 2.2–13.2)

## 2018-12-11 NOTE — Progress Notes (Signed)
Patient notified of lab results. He will avoid mammalian meat and mammalian products and have an epinephrine device available at all times. We will recheck alpha gal panel in 1 year.

## 2019-01-06 ENCOUNTER — Encounter: Payer: Self-pay | Admitting: Internal Medicine

## 2019-01-06 ENCOUNTER — Other Ambulatory Visit: Payer: Self-pay | Admitting: Internal Medicine

## 2019-01-06 ENCOUNTER — Other Ambulatory Visit: Payer: Self-pay

## 2019-01-06 ENCOUNTER — Ambulatory Visit (INDEPENDENT_AMBULATORY_CARE_PROVIDER_SITE_OTHER): Payer: Self-pay | Admitting: Internal Medicine

## 2019-01-06 VITALS — BP 102/76 | HR 94 | Temp 98.3°F | Ht 70.0 in | Wt 226.4 lb

## 2019-01-06 DIAGNOSIS — E559 Vitamin D deficiency, unspecified: Secondary | ICD-10-CM | POA: Insufficient documentation

## 2019-01-06 DIAGNOSIS — F32A Depression, unspecified: Secondary | ICD-10-CM

## 2019-01-06 DIAGNOSIS — E669 Obesity, unspecified: Secondary | ICD-10-CM

## 2019-01-06 DIAGNOSIS — K7581 Nonalcoholic steatohepatitis (NASH): Secondary | ICD-10-CM

## 2019-01-06 DIAGNOSIS — F329 Major depressive disorder, single episode, unspecified: Secondary | ICD-10-CM

## 2019-01-06 DIAGNOSIS — E876 Hypokalemia: Secondary | ICD-10-CM

## 2019-01-06 DIAGNOSIS — Z87891 Personal history of nicotine dependence: Secondary | ICD-10-CM

## 2019-01-06 LAB — COMPREHENSIVE METABOLIC PANEL
ALT: 19 U/L (ref 0–53)
AST: 17 U/L (ref 0–37)
Albumin: 4.5 g/dL (ref 3.5–5.2)
Alkaline Phosphatase: 64 U/L (ref 39–117)
BUN: 8 mg/dL (ref 6–23)
CO2: 29 mEq/L (ref 19–32)
Calcium: 9.5 mg/dL (ref 8.4–10.5)
Chloride: 102 mEq/L (ref 96–112)
Creatinine, Ser: 1.02 mg/dL (ref 0.40–1.50)
GFR: 84.48 mL/min (ref >60.00)
Glucose, Bld: 80 mg/dL (ref 70–99)
Potassium: 4.5 mEq/L (ref 3.5–5.1)
Sodium: 138 mEq/L (ref 135–145)
Total Bilirubin: 0.6 mg/dL (ref 0.2–1.2)
Total Protein: 7 g/dL (ref 6.0–8.3)

## 2019-01-06 LAB — CBC WITH DIFFERENTIAL/PLATELET
Basophils Absolute: 0 10*3/uL (ref 0.0–0.1)
Basophils Relative: 0.4 % (ref 0.0–3.0)
Eosinophils Absolute: 0.2 10*3/uL (ref 0.0–0.7)
Eosinophils Relative: 2.8 % (ref 0.0–5.0)
HCT: 47.4 % (ref 39.0–52.0)
Hemoglobin: 16.4 g/dL (ref 13.0–17.0)
Lymphocytes Relative: 32.8 % (ref 12.0–46.0)
Lymphs Abs: 2.1 10*3/uL (ref 0.7–4.0)
MCHC: 34.6 g/dL (ref 30.0–36.0)
MCV: 86.7 fl (ref 78.0–100.0)
Monocytes Absolute: 0.5 10*3/uL (ref 0.1–1.0)
Monocytes Relative: 8 % (ref 3.0–12.0)
Neutro Abs: 3.5 10*3/uL (ref 1.4–7.7)
Neutrophils Relative %: 56 % (ref 43.0–77.0)
Platelets: 241 10*3/uL (ref 150.0–400.0)
RBC: 5.46 Mil/uL (ref 4.22–5.81)
RDW: 12.5 % (ref 11.5–15.5)
WBC: 6.3 10*3/uL (ref 4.0–10.5)

## 2019-01-06 LAB — LIPID PANEL
Cholesterol: 186 mg/dL (ref 0–200)
HDL: 35.7 mg/dL — ABNORMAL LOW (ref >39.00)
LDL Cholesterol: 133 mg/dL — ABNORMAL HIGH (ref 0–99)
NonHDL: 150.72
Total CHOL/HDL Ratio: 5
Triglycerides: 89 mg/dL (ref 0.0–149.0)
VLDL: 17.8 mg/dL (ref 0.0–40.0)

## 2019-01-06 LAB — TSH: TSH: 1.51 u[IU]/mL (ref 0.35–4.50)

## 2019-01-06 LAB — VITAMIN B12: Vitamin B-12: 405 pg/mL (ref 211–911)

## 2019-01-06 LAB — HEMOGLOBIN A1C: Hgb A1c MFr Bld: 5.5 % (ref 4.6–6.5)

## 2019-01-06 LAB — VITAMIN D 25 HYDROXY (VIT D DEFICIENCY, FRACTURES): VITD: 20.14 ng/mL — ABNORMAL LOW (ref 30.00–100.00)

## 2019-01-06 MED ORDER — VITAMIN D (ERGOCALCIFEROL) 1.25 MG (50000 UNIT) PO CAPS: 50000.0000 [IU] | ORAL_CAPSULE | ORAL | 0 refills | Status: AC

## 2019-01-06 NOTE — Addendum Note (Signed)
Addended by: Elmer Picker on: 01/06/2019 11:56 AM   Modules accepted: Orders

## 2019-01-06 NOTE — Progress Notes (Signed)
New Patient Office Visit     CC/Reason for Visit: Establish care, discuss chronic conditions Previous PCP: unknown Last Visit: unknown  HPI: Jared Hunt is a 33 y.o. male who is coming in today for the above mentioned reasons. Past Medical History is significant for: NASH and obesity. He would to establish with a PCP for preventive care. He saw GI last year for fatty live and was told he needed to loose weight if he wanted to avoid cirrhosis in the future. He has dropped 60 pounds since November. BMI was dropped from 38 to 32. He is fasting and would like to do his CPE lab work today if possible.   Past Medical/Surgical History: Past Medical History:  Diagnosis Date  . Anxiety   . Depression age 68 - 53  . Fatty liver   . Sepsis ([place]) 03/2016   presumed sepsis following months of respiratory symptoms felt secondary to mold in living quarters    No past surgical history on file.  Social History:  reports that he has quit smoking. His smoking use included cigarettes. He has a 8.50 pack-year smoking history. He has never used smokeless tobacco. He reports current alcohol use. He reports current drug use. Drug: Marijuana.  Allergies: Allergies  Allergen Reactions  . Sulfa Antibiotics Anaphylaxis    Family History:  Family History  Problem Relation Age of Onset  . Osteoarthritis Mother   . Blindness Sister   . Hypothyroidism Brother   . Hypothyroidism Sister   . Asthma Neg Hx   . Allergic Disorder Neg Hx      Current Outpatient Medications:  .  EPINEPHrine 0.3 mg/0.3 mL IJ SOAJ injection, INJECT CONTENTS OF 1 PEN INTO THE MUSCLE ONCE PRN, Disp: , Rfl:  .  fexofenadine (ALLEGRA) 180 MG tablet, Take 180 mg by mouth daily as needed for allergies or rhinitis., Disp: , Rfl:  .  LYCOPENE PO, Take by mouth., Disp: , Rfl:  .  Pomegranate, Punica granatum, (POMEGRANATE PO), Take by mouth., Disp: , Rfl:  .  RESVERATROL PO, Take by mouth., Disp: , Rfl:   Review of  Systems:  Constitutional: Denies fever, chills, diaphoresis, appetite change and fatigue.  HEENT: Denies photophobia, eye pain, redness, hearing loss, ear pain, congestion, sore throat, rhinorrhea, sneezing, mouth sores, trouble swallowing, neck pain, neck stiffness and tinnitus.   Respiratory: Denies SOB, DOE, cough, chest tightness,  and wheezing.   Cardiovascular: Denies chest pain, palpitations and leg swelling.  Gastrointestinal: Denies nausea, vomiting, abdominal pain, diarrhea, constipation, blood in stool and abdominal distention.  Genitourinary: Denies dysuria, urgency, frequency, hematuria, flank pain and difficulty urinating.  Endocrine: Denies: hot or cold intolerance, sweats, changes in hair or nails, polyuria, polydipsia. Musculoskeletal: Denies myalgias, back pain, joint swelling, arthralgias and gait problem.  Skin: Denies pallor, rash and wound.  Neurological: Denies dizziness, seizures, syncope, weakness, light-headedness, numbness and headaches.  Hematological: Denies adenopathy. Easy bruising, personal or family bleeding history  Psychiatric/Behavioral: Denies suicidal ideation, mood changes, confusion, nervousness, sleep disturbance and agitation    Physical Exam: Vitals:   01/06/19 1130  BP: 102/76  Pulse: 94  Temp: 98.3 F (36.8 C)  TempSrc: Temporal  SpO2: 97%  Weight: 226 lb 6.4 oz (102.7 kg)  Height: 5' 10"  (1.778 m)   Body mass index is 32.49 kg/m.  Constitutional: NAD, calm, comfortable Eyes: PERRL, lids and conjunctivae normal ENMT: Mucous membranes are moist.  Respiratory: clear to auscultation bilaterally, no wheezing, no crackles. Normal respiratory effort. No  accessory muscle use.  Cardiovascular: Regular rate and rhythm, no murmurs / rubs / gallops. No extremity edema. 2+ pedal pulses. No carotid bruits.  Abdomen: no tenderness, no masses palpated. No hepatosplenomegaly. Bowel sounds positive.  Musculoskeletal: no clubbing / cyanosis. No joint  deformity upper and lower extremities. Good ROM, no contractures. Normal muscle tone.  Skin: no rashes, lesions, ulcers. No induration Neurologic:grossly intact and nonfocal Psychiatric: Normal judgment and insight. Alert and oriented x 3. Normal mood.    Impression and Plan:  Obesity (BMI 30.0-34.9)  -Discussed healthy lifestyle, including increased physical activity and better food choices to promote weight loss. -Continue to work on weight loss.  NASH -Recheck LFTs and lipids today.  Depression, unspecified depression type    Office Visit from 01/06/2019 in [facility] at [place]  PHQ-9 Total Score  7     He is not interested in meds or CBT.  Hypokalemia -Check CMP today.  Former smoker -He quit about 4 years ago.    Patient Instructions  -Nice meeting you today!!  -Please schedule return visit to complete your physical and discuss blood work that will be done today.      Lelon Frohlich, MD [facility]

## 2019-01-06 NOTE — Patient Instructions (Signed)
-  Nice meeting you today!!  -Please schedule return visit to complete your physical and discuss blood work that will be done today.

## 2019-01-20 ENCOUNTER — Ambulatory Visit (INDEPENDENT_AMBULATORY_CARE_PROVIDER_SITE_OTHER): Payer: Self-pay | Admitting: Internal Medicine

## 2019-01-20 ENCOUNTER — Other Ambulatory Visit: Payer: Self-pay

## 2019-01-20 ENCOUNTER — Encounter: Payer: Self-pay | Admitting: Internal Medicine

## 2019-01-20 VITALS — BP 110/80 | HR 95 | Temp 98.2°F | Ht 70.5 in | Wt 226.7 lb

## 2019-01-20 DIAGNOSIS — Z0000 Encounter for general adult medical examination without abnormal findings: Secondary | ICD-10-CM

## 2019-01-20 DIAGNOSIS — E669 Obesity, unspecified: Secondary | ICD-10-CM

## 2019-01-20 DIAGNOSIS — F419 Anxiety disorder, unspecified: Secondary | ICD-10-CM

## 2019-01-20 DIAGNOSIS — E559 Vitamin D deficiency, unspecified: Secondary | ICD-10-CM

## 2019-01-20 NOTE — Progress Notes (Signed)
Established Patient Office Visit     CC/Reason for Visit: Annual preventive exam  HPI: Jared Hunt is a 33 y.o. male who is coming in today for the above mentioned reasons. Past Medical History is significant for: Obesity and [place].  He came in 2 weeks ago for his physical blood work and I was impressed by his improvement.  He is no longer prediabetic, he has improved his triglycerides from 169 down to a normal 89.  His LDL remains the same at about 133.  He was also diagnosed with vitamin D deficiency and is receiving high-dose vitamin D supplementation.  He has been complaining of some anxiety and is wanting referral for CBT sessions.  He will think about flu shot but does not want to receive it today.   Past Medical/Surgical History: Past Medical History:  Diagnosis Date  . Anxiety   . Depression age 32 - 72  . Fatty liver   . Sepsis ([place]) 03/2016   presumed sepsis following months of respiratory symptoms felt secondary to mold in living quarters    No past surgical history on file.  Social History:  reports that he has quit smoking. His smoking use included cigarettes. He has a 8.50 pack-year smoking history. He has never used smokeless tobacco. He reports current alcohol use. He reports current drug use. Drug: Marijuana.  Allergies: Allergies  Allergen Reactions  . Sulfa Antibiotics Anaphylaxis    Family History:  Family History  Problem Relation Age of Onset  . Osteoarthritis Mother   . Blindness Sister   . Hypothyroidism Brother   . Hypothyroidism Sister   . Asthma Neg Hx   . Allergic Disorder Neg Hx      Current Outpatient Medications:  .  EPINEPHrine 0.3 mg/0.3 mL IJ SOAJ injection, INJECT CONTENTS OF 1 PEN INTO THE MUSCLE ONCE PRN, Disp: , Rfl:  .  fexofenadine (ALLEGRA) 180 MG tablet, Take 180 mg by mouth daily as needed for allergies or rhinitis., Disp: , Rfl:  .  LYCOPENE PO, Take by mouth., Disp: , Rfl:  .  Pomegranate, Pun[place] granatum,  (POMEGRANATE PO), Take by mouth., Disp: , Rfl:  .  RESVERATROL PO, Take by mouth., Disp: , Rfl:  .  Vitamin D, Ergocalciferol, (DRISDOL) 1.25 MG (50000 UT) CAPS capsule, Take 1 capsule (50,000 Units total) by mouth every 7 (seven) days for 12 doses., Disp: 12 capsule, Rfl: 0  Review of Systems:  Constitutional: Denies fever, chills, diaphoresis, appetite change and fatigue.  HEENT: Denies photophobia, eye pain, redness, hearing loss, ear pain, congestion, sore throat, rhinorrhea, sneezing, mouth sores, trouble swallowing, neck pain, neck stiffness and tinnitus.   Respiratory: Denies SOB, DOE, cough, chest tightness,  and wheezing.   Cardiovascular: Denies chest pain, palpitations and leg swelling.  Gastrointestinal: Denies nausea, vomiting, abdominal pain, diarrhea, constipation, blood in stool and abdominal distention.  Genitourinary: Denies dysuria, urgency, frequency, hematuria, flank pain and difficulty urinating.  Endocrine: Denies: hot or cold intolerance, sweats, changes in hair or nails, polyuria, polydipsia. Musculoskeletal: Denies myalgias, back pain, joint swelling, arthralgias and gait problem.  Skin: Denies pallor, rash and wound.  Neurological: Denies dizziness, seizures, syncope, weakness, light-headedness, numbness and headaches.  Hematological: Denies adenopathy. Easy bruising, personal or family bleeding history  Psychiatric/Behavioral: Denies suicidal ideation, mood changes, confusion, nervousness, sleep disturbance and agitation    Physical Exam: Vitals:   01/20/19 0845  BP: 110/80  Pulse: 95  Temp: 98.2 F (36.8 C)  TempSrc: Temporal  SpO2:  97%  Weight: 226 lb 11.2 oz (102.8 kg)  Height: 5' 10.5" (1.791 m)    Body mass index is 32.07 kg/m.   Constitutional: NAD, calm, comfortable Eyes: PERRL, lids and conjunctivae normal ENMT: Mucous membranes are moist.  Tympanic membrane is pearly white, no erythema or bulging. Neck: normal, supple, no masses, no  thyromegaly Respiratory: clear to auscultation bilaterally, no wheezing, no crackles. Normal respiratory effort. No accessory muscle use.  Cardiovascular: Regular rate and rhythm, no murmurs / rubs / gallops. No extremity edema. 2+ pedal pulses.  Abdomen: no tenderness, no masses palpated. No hepatosplenomegaly. Bowel sounds positive.  Musculoskeletal: no clubbing / cyanosis. No joint deformity upper and lower extremities. Good ROM, no contractures. Normal muscle tone.  Skin: no rashes, lesions, ulcers. No induration Neurologic: CN 2-12 grossly intact. Sensation intact, DTR normal. Strength 5/5 in all 4.  Psychiatric: Normal judgment and insight. Alert and oriented x 3. Normal mood.    Impression and Plan:  Encounter for preventive health examination -Have advised routine eye and dental care. -Is due for flu vaccination but he will think about it. -Healthy lifestyle has been discussed in detail, he has been encouraged in his success with weight loss and improving metabolic profile. -Commence routine colon cancer screening at age 77.  Vitamin D deficiency -On high-dose vitamin D supplementation for 12 weeks, recheck levels after  Anxiety -Will refer for CBT sessions, have advised Mr. Jared Hunt in our office.  Obesity (BMI 30.0-34.9) -Discussed healthy lifestyle, including increased physical activity and better food choices to promote weight loss.    Patient Instructions  -Nice seeing you today!!  -As discussed, your labs look great. Continue weight loss efforts.  -Please arrange eye and dental care.  -Consider receiving flu vaccine this year.  -Please schedule counseling sessions. 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  -Schedule follow up in 6 months.   Preventive Care 48-54 Years Old, Male Preventive care refers to lifestyle choices and visits with your health care provider that can promote health and wellness. This includes:  A  yearly physical exam. This is also called an annual well check.  Regular dental and eye exams.  Immunizations.  Screening for certain conditions.  Healthy lifestyle choices, such as eating a healthy diet, getting regular exercise, not using drugs or products that contain nicotine and tobacco, and limiting alcohol use. What can I expect for my preventive care visit? Physical exam Your health care provider will check:  Height and weight. These may be used to calculate body mass index (BMI), which is a measurement that tells if you are at a healthy weight.  Heart rate and blood pressure.  Your skin for abnormal spots. Counseling Your health care provider may ask you questions about:  Alcohol, tobacco, and drug use.  Emotional well-being.  Home and relationship well-being.  Sexual activity.  Eating habits.  Work and work Statistician. What immunizations do I need?  Influenza (flu) vaccine  This is recommended every year. Tetanus, diphtheria, and pertussis (Tdap) vaccine  You may need a Td booster every 10 years. Varicella (chickenpox) vaccine  You may need this vaccine if you have not already been vaccinated. Human papillomavirus (HPV) vaccine  If recommended by your health care provider, you may need three doses over 6 months. Measles, mumps, and rubella (MMR) vaccine  You may need at least one dose of MMR. You may also need a second dose. Meningococcal conjugate (MenACWY) vaccine  One  dose is recommended if you are 51-103 years old and a Market researcher living in a residence hall, or if you have one of several medical conditions. You may also need additional booster doses. Pneumococcal conjugate (PCV13) vaccine  You may need this if you have certain conditions and were not previously vaccinated. Pneumococcal polysaccharide (PPSV23) vaccine  You may need one or two doses if you smoke cigarettes or if you have certain conditions. Hepatitis A vaccine   You may need this if you have certain conditions or if you travel or work in places where you may be exposed to hepatitis A. Hepatitis B vaccine  You may need this if you have certain conditions or if you travel or work in places where you may be exposed to hepatitis B. Haemophilus influenzae type b (Hib) vaccine  You may need this if you have certain risk factors. You may receive vaccines as individual doses or as more than one vaccine together in one shot (combination vaccines). Talk with your health care provider about the risks and benefits of combination vaccines. What tests do I need? Blood tests  Lipid and cholesterol levels. These may be checked every 5 years starting at age 63.  Hepatitis C test.  Hepatitis B test. Screening   Diabetes screening. This is done by checking your blood sugar (glucose) after you have not eaten for a while (fasting).  Sexually transmitted disease (STD) testing. Talk with your health care provider about your test results, treatment options, and if necessary, the need for more tests. Follow these instructions at home: Eating and drinking   Eat a diet that includes fresh fruits and vegetables, whole grains, lean protein, and low-fat dairy products.  Take vitamin and mineral supplements as recommended by your health care provider.  Do not drink alcohol if your health care provider tells you not to drink.  If you drink alcohol: ? Limit how much you have to 0-2 drinks a day. ? Be aware of how much alcohol is in your drink. In the [place] one drink equals one 12 oz bottle of beer (355 mL), one 5 oz glass of wine (148 mL), or one 1 oz glass of hard liquor (44 mL). Lifestyle  Take daily care of your teeth and gums.  Stay active. Exercise for at least 30 minutes on 5 or more days each week.  Do not use any products that contain nicotine or tobacco, such as cigarettes, e-cigarettes, and chewing tobacco. If you need help quitting, ask your health care  provider.  If you are sexually active, practice safe sex. Use a condom or other form of protection to prevent STIs (sexually transmitted infections). What's next?  Go to your health care provider once a year for a well check visit.  Ask your health care provider how often you should have your eyes and teeth checked.  Stay up to date on all vaccines. This information is not intended to replace advice given to you by your health care provider. Make sure you discuss any questions you have with your health care provider. Document Released: 07/07/2001 Document Revised: 05/05/2018 Document Reviewed: 05/05/2018 Elsevier Patient Education  2020 [place], [place] [facility]

## 2019-01-20 NOTE — Patient Instructions (Addendum)
-Nice seeing you today!!  -As discussed, your labs look great. Continue weight loss efforts.  -Please arrange eye and dental care.  -Consider receiving flu vaccine this year.  -Please schedule counseling sessions. Dennison Bulla works out of our office and I have heard good things about him.  -Schedule follow up in 6 months.   Preventive Care 21-32 Years Old, Male Preventive care refers to lifestyle choices and visits with your health care provider that can promote health and wellness. This includes:  A yearly physical exam. This is also called an annual well check.  Regular dental and eye exams.  Immunizations.  Screening for certain conditions.  Healthy lifestyle choices, such as eating a healthy diet, getting regular exercise, not using drugs or products that contain nicotine and tobacco, and limiting alcohol use. What can I expect for my preventive care visit? Physical exam Your health care provider will check:  Height and weight. These may be used to calculate body mass index (BMI), which is a measurement that tells if you are at a healthy weight.  Heart rate and blood pressure.  Your skin for abnormal spots. Counseling Your health care provider may ask you questions about:  Alcohol, tobacco, and drug use.  Emotional well-being.  Home and relationship well-being.  Sexual activity.  Eating habits.  Work and work Statistician. What immunizations do I need?  Influenza (flu) vaccine  This is recommended every year. Tetanus, diphtheria, and pertussis (Tdap) vaccine  You may need a Td booster every 10 years. Varicella (chickenpox) vaccine  You may need this vaccine if you have not already been vaccinated. Human papillomavirus (HPV) vaccine  If recommended by your health care provider, you may need three doses over 6 months. Measles, mumps, and rubella (MMR) vaccine  You may need at least one dose of MMR. You may also need a second dose. Meningococcal  conjugate (MenACWY) vaccine  One dose is recommended if you are 72-37 years old and a Market researcher living in a residence hall, or if you have one of several medical conditions. You may also need additional booster doses. Pneumococcal conjugate (PCV13) vaccine  You may need this if you have certain conditions and were not previously vaccinated. Pneumococcal polysaccharide (PPSV23) vaccine  You may need one or two doses if you smoke cigarettes or if you have certain conditions. Hepatitis A vaccine  You may need this if you have certain conditions or if you travel or work in places where you may be exposed to hepatitis A. Hepatitis B vaccine  You may need this if you have certain conditions or if you travel or work in places where you may be exposed to hepatitis B. Haemophilus influenzae type b (Hib) vaccine  You may need this if you have certain risk factors. You may receive vaccines as individual doses or as more than one vaccine together in one shot (combination vaccines). Talk with your health care provider about the risks and benefits of combination vaccines. What tests do I need? Blood tests  Lipid and cholesterol levels. These may be checked every 5 years starting at age 60.  Hepatitis C test.  Hepatitis B test. Screening   Diabetes screening. This is done by checking your blood sugar (glucose) after you have not eaten for a while (fasting).  Sexually transmitted disease (STD) testing. Talk with your health care provider about your test results, treatment options, and if necessary, the need for more tests. Follow these instructions at home: Eating and drinking  Eat a diet that includes fresh fruits and vegetables, whole grains, lean protein, and low-fat dairy products.  Take vitamin and mineral supplements as recommended by your health care provider.  Do not drink alcohol if your health care provider tells you not to drink.  If you drink  alcohol: ? Limit how much you have to 0-2 drinks a day. ? Be aware of how much alcohol is in your drink. In the [place] one drink equals one 12 oz bottle of beer (355 mL), one 5 oz glass of wine (148 mL), or one 1 oz glass of hard liquor (44 mL). Lifestyle  Take daily care of your teeth and gums.  Stay active. Exercise for at least 30 minutes on 5 or more days each week.  Do not use any products that contain nicotine or tobacco, such as cigarettes, e-cigarettes, and chewing tobacco. If you need help quitting, ask your health care provider.  If you are sexually active, practice safe sex. Use a condom or other form of protection to prevent STIs (sexually transmitted infections). What's next?  Go to your health care provider once a year for a well check visit.  Ask your health care provider how often you should have your eyes and teeth checked.  Stay up to date on all vaccines. This information is not intended to replace advice given to you by your health care provider. Make sure you discuss any questions you have with your health care provider. Document Released: 07/07/2001 Document Revised: 05/05/2018 Document Reviewed: 05/05/2018 Elsevier Patient Education  2020 [employer].

## 2019-02-08 ENCOUNTER — Ambulatory Visit: Payer: Self-pay | Admitting: Allergy

## 2019-02-24 ENCOUNTER — Encounter: Payer: Self-pay | Admitting: Cardiology

## 2019-02-24 ENCOUNTER — Ambulatory Visit (INDEPENDENT_AMBULATORY_CARE_PROVIDER_SITE_OTHER): Payer: Self-pay | Admitting: Cardiology

## 2019-02-24 ENCOUNTER — Other Ambulatory Visit: Payer: Self-pay

## 2019-02-24 VITALS — BP 130/86 | HR 93 | Ht 70.5 in | Wt 224.7 lb

## 2019-02-24 DIAGNOSIS — Z7182 Exercise counseling: Secondary | ICD-10-CM

## 2019-02-24 DIAGNOSIS — Z7189 Other specified counseling: Secondary | ICD-10-CM

## 2019-02-24 DIAGNOSIS — Z713 Dietary counseling and surveillance: Secondary | ICD-10-CM

## 2019-02-24 DIAGNOSIS — I878 Other specified disorders of veins: Secondary | ICD-10-CM

## 2019-02-24 NOTE — Patient Instructions (Signed)
Medication Instructions:  Your Physician recommend you continue on your current medication as directed.    If you need a refill on your cardiac medications before your next appointment, please call your pharmacy.   Lab work: None   Testing/Procedures: None  Follow-Up: At [facility], you and your health needs are our priority.  As part of our continuing mission to provide you with exceptional heart care, we have created designated Provider Care Teams.  These Care Teams include your primary Cardiologist (physician) and Advanced Practice Providers (APPs -  Physician Assistants and Nurse Practitioners) who all work together to provide you with the care you need, when you need it. You will need a follow up appointment as needed.  Please call our office 2 months in advance to schedule this appointment.  You may see Buford Dresser, MD or one of the following Advanced Practice Providers on your designated Care Team:   Rosaria Ferries, PA-C . Jory Sims, DNP, ANP

## 2019-02-24 NOTE — Progress Notes (Signed)
Cardiology Office Note:    Date:  02/24/2019   ID:  Jared Hunt, DOB May 16, 1986, MRN [id number]  PCP:  Isaac Bliss, Rayford Halsted, MD  Cardiologist:  Buford Dresser, MD PhD  Referring MD: Isaac Bliss, [name]   CC: follow up, concerns re: hand veins  History of Present Illness:    Jared Hunt is a 33 y.o. male with a hx of anxiety and depression who is seen today for follow up. I initially met him as a new consult at the request of Leotis Shames* for the evaluation and management of chest pain, shortness of breath, and palpitations on 03/17/18.  Today: Mentally doing "terrible", very anxious. Concerned about the veins in his hands as they have become more prevalent. Has carpal tunnel, has broken wrist before and not had it fixed. Works on Teaching laboratory technician all day long. Worried that this is a sign there is something wrong with his cardiovascular system.  Has made excellent lifestyle changes since visit a year ago. Has been running. Found out he is alpha-gal deficient, no meat or animal products now. Eating very well, lots of vegetables. Doing cold showers every morning, has read about vascular system, etc, worried about his circulation.   We spent significant time today discussing normal vascular system, red flag warning signs, etc.  Past Medical History:  Diagnosis Date  . Anxiety   . Depression age 42 - 72  . Fatty liver   . Sepsis ([place]) 03/2016   presumed sepsis following months of respiratory symptoms felt secondary to mold in living quarters    No past surgical history on file.  Current Medications: Current Outpatient Medications on File Prior to Visit  Medication Sig  . EPINEPHrine 0.3 mg/0.3 mL IJ SOAJ injection INJECT CONTENTS OF 1 PEN INTO THE MUSCLE ONCE PRN  . fexofenadine (ALLEGRA) 180 MG tablet Take 180 mg by mouth daily as needed for allergies or rhinitis.  Marland Kitchen LYCOPENE PO Take by mouth.  . Pomegranate, Punica granatum, (POMEGRANATE PO)  Take by mouth.  . RESVERATROL PO Take by mouth.  . Vitamin D, Ergocalciferol, (DRISDOL) 1.25 MG (50000 UT) CAPS capsule Take 1 capsule (50,000 Units total) by mouth every 7 (seven) days for 12 doses.   No current facility-administered medications on file prior to visit.      Allergies:   Sulfa antibiotics   Social History   Tobacco Use  . Smoking status: Former Smoker    Packs/day: 0.50    Years: 17.00    Pack years: 8.50    Types: Cigarettes  . Smokeless tobacco: Never Used  Substance Use Topics  . Alcohol use: Yes    Comment: twice weekly  . Drug use: Not Currently    Types: Marijuana    Comment: MJ in teens only    Family History: The patient's family history includes Blindness in his sister; Hypothyroidism in his brother and sister; Osteoarthritis in his mother. There is no history of Asthma or Allergic Disorder.  FH: father's side is unknown. Mother is a smoker. Mat Gpa had stents, 4V CABG in his 35s, was a Ecologist in the past. Does have a history of high cholesterol (mother, maternal gma).  ROS:   Please see the history of present illness.  Additional pertinent ROS: Constitutional: Negative for chills, fever, night sweats, unintentional weight loss  HENT: Negative for ear pain and hearing loss.   Eyes: Negative for loss of vision and eye pain.  Respiratory: Negative for cough, sputum, wheezing.  Cardiovascular: See HPI. Gastrointestinal: Negative for abdominal pain, melena, and hematochezia.  Genitourinary: Negative for dysuria and hematuria.  Musculoskeletal: Negative for falls and myalgias.  Skin: Negative for itching and rash.  Neurological: Negative for focal weakness, focal sensory changes and loss of consciousness.  Endo/Heme/Allergies: Does not bruise/bleed easily.    EKGs/Labs/Other Studies Reviewed:    The following studies were reviewed today: ECG, labs, prior ER notes  EKG:  EKG is ordered today.  The ekg ordered today demonstrates normal sinus  rhythm, no change since prior  Recent Labs: 01/06/2019: ALT 19; BUN 8; Creatinine, Ser 1.02; Hemoglobin 16.4; Platelets 241.0; Potassium 4.5; Sodium 138; TSH 1.51  Recent Lipid Panel    Component Value Date/Time   CHOL 186 01/06/2019 1156   CHOL 201 (H) 03/17/2018 0957   TRIG 89.0 01/06/2019 1156   HDL 35.70 (L) 01/06/2019 1156   HDL 41 03/17/2018 0957   CHOLHDL 5 01/06/2019 1156   VLDL 17.8 01/06/2019 1156   LDLCALC 133 (H) 01/06/2019 1156   LDLCALC 126 (H) 03/17/2018 0957    Physical Exam:    VS:  BP 130/86   Pulse 93   Ht 5' 10.5" (1.791 m)   Wt 224 lb 11.2 oz (101.9 kg)   SpO2 100%   BMI 31.79 kg/m     Wt Readings from Last 3 Encounters:  02/24/19 224 lb 11.2 oz (101.9 kg)  01/20/19 226 lb 11.2 oz (102.8 kg)  01/06/19 226 lb 6.4 oz (102.7 kg)    GEN: Well nourished, well developed in no acute distress HEENT: Normal, moist mucous membranes NECK: No JVD CARDIAC: regular rhythm, normal S1 and S2, no murmurs, rubs, gallops.  VASCULAR: Radial and DP pulses 2+ bilaterally. No carotid bruits RESPIRATORY:  Clear to auscultation without rales, wheezing or rhonchi  ABDOMEN: Soft, non-tender, non-distended MUSCULOSKELETAL:  Ambulates independently SKIN: Warm and dry, no edema NEUROLOGIC:  Alert and oriented x 3. No focal neuro deficits noted. PSYCHIATRIC:  Normal affect   ASSESSMENT:    1. Prominent vein   2. Cardiac risk counseling   3. Counseling on health promotion and disease prevention   4. Nutritional counseling   5. Exercise counseling    PLAN:    Concern re: prominent had veins: examined today. Very strong radial and ulnar pulses bilaterally today. Hand veins do not appear engorged. Appear within normal limits -reassurance given today  Lifestyle change:  -weight was 250 lbs last year ,today 224 lbs. Excellent lifestyle changes.   CV risk counseling and primary prevention: -recommend heart healthy/Mediterranean diet, with whole grains, fruits, vegetable,  fish, lean meats, nuts, and olive oil. Limit salt. He has made excellent changes, no animal products, since being diagnosed with alpha-gal deficiency -recommend moderate walking, 3-5 times/week for 30-50 minutes each session. Aim for at least 150 minutes.week. Goal should be pace of 3 miles/hours, or walking 1.5 miles in 30 minutes. He is now running, doing very well with lifestyle change -recommend avoidance of tobacco products. Avoid excess alcohol. -Additional risk factor control:  -Diabetes: A1c now 5.5 from 6.1  -Lipids: done 12/2018, Tchol 186, HDL 35, LDL 133, TG 86  -Blood pressure control: elevated slightly today, but not at a level that requires medication. Recommend continued lifestyle changes  -Weight: doing an excellent job, now BMI 31 from 38.  Plan for follow up: as needed  TIME SPENT WITH PATIENT: 25 minutes of direct patient care. More than 50% of that time was spent on coordination of care and counseling regarding  normal circulation/anatomy, red flag signs, lifestyle recommendations.  Buford Dresser, MD, PhD [place]  [facility]   Medication Adjustments/Labs and Tests Ordered: Current medicines are reviewed at length with the patient today.  Concerns regarding medicines are outlined above.  Orders Placed This Encounter  Procedures  . EKG 12-Lead   No orders of the defined types were placed in this encounter.   Patient Instructions  Medication Instructions:  Your Physician recommend you continue on your current medication as directed.    If you need a refill on your cardiac medications before your next appointment, please call your pharmacy.   Lab work: None   Testing/Procedures: None  Follow-Up: At [facility], you and your health needs are our priority.  As part of our continuing mission to provide you with exceptional heart care, we have created designated Provider Care Teams.  These Care Teams include your primary Cardiologist (physician)  and Advanced Practice Providers (APPs -  Physician Assistants and Nurse Practitioners) who all work together to provide you with the care you need, when you need it. You will need a follow up appointment as needed.  Please call our office 2 months in advance to schedule this appointment.  You may see Buford Dresser, MD or one of the following Advanced Practice Providers on your designated Care Team:   Rosaria Ferries, PA-C . Jory Sims, DNP, ANP        Signed, Buford Dresser, MD PhD 02/24/2019 2:47 PM    [place]

## 2019-02-26 ENCOUNTER — Encounter: Payer: Self-pay | Admitting: Cardiology

## 2019-03-02 ENCOUNTER — Ambulatory Visit: Payer: Self-pay | Admitting: Internal Medicine

## 2019-03-02 DIAGNOSIS — Z0289 Encounter for other administrative examinations: Secondary | ICD-10-CM

## 2019-03-09 ENCOUNTER — Ambulatory Visit (INDEPENDENT_AMBULATORY_CARE_PROVIDER_SITE_OTHER): Payer: Self-pay

## 2019-03-09 ENCOUNTER — Ambulatory Visit: Payer: Self-pay

## 2019-03-09 ENCOUNTER — Encounter: Payer: Self-pay | Admitting: Orthopaedic Surgery

## 2019-03-09 ENCOUNTER — Ambulatory Visit (INDEPENDENT_AMBULATORY_CARE_PROVIDER_SITE_OTHER): Payer: Self-pay | Admitting: Orthopaedic Surgery

## 2019-03-09 ENCOUNTER — Other Ambulatory Visit: Payer: Self-pay

## 2019-03-09 ENCOUNTER — Other Ambulatory Visit: Payer: Self-pay | Admitting: Orthopaedic Surgery

## 2019-03-09 VITALS — Ht 69.5 in | Wt 230.0 lb

## 2019-03-09 DIAGNOSIS — M76822 Posterior tibial tendinitis, left leg: Secondary | ICD-10-CM

## 2019-03-09 DIAGNOSIS — G8929 Other chronic pain: Secondary | ICD-10-CM

## 2019-03-09 DIAGNOSIS — M25571 Pain in right ankle and joints of right foot: Secondary | ICD-10-CM

## 2019-03-09 DIAGNOSIS — M25572 Pain in left ankle and joints of left foot: Secondary | ICD-10-CM

## 2019-03-09 MED ORDER — DICLOFENAC SODIUM 1 % TD GEL: 2.0000 g | Freq: Four times a day (QID) | TRANSDERMAL | 2 refills | Status: AC | PRN

## 2019-03-09 NOTE — Progress Notes (Signed)
Office Visit Note   Patient: Jared Hunt           Date of Birth: 1986/01/08           MRN: [id number] Visit Date: 03/09/2019              Requested by: Isaac Bliss, Rayford Halsted, MD [place],  [place] [address] PCP: Isaac Bliss, Rayford Halsted, MD   Assessment & Plan: Visit Diagnoses:  1. Posterior tibial tendonitis, left   2. Chronic pain of both ankles     Plan: Impression is left foot posterior tibial tendon dysfunction and tendinitis and right foot lateral column overload.  We will provide the patient with custom forefoot orthotic prescription for biotech.  We will also call in Voltaren gel and start him in physical therapy.  He will follow-up with [place] as needed.  Call with concerns or questions in meantime.  Follow-Up Instructions: Return if symptoms worsen or fail to improve.   Orders:  No orders of the defined types were placed in this encounter.  Meds ordered this encounter  Medications  . diclofenac sodium (VOLTAREN) 1 % GEL    Sig: Apply 2 g topically 4 (four) times daily as needed.    Dispense:  150 g    Refill:  2      Procedures: No procedures performed   Clinical Data: No additional findings.   Subjective: Chief Complaint  Patient presents with  . Right Ankle - Pain  . Left Ankle - Pain    HPI patient is a pleasant 33 year old gentleman who presents our clinic today with bilateral ankle pain left greater than right.  This has been ongoing for the past 6 months.  No specific injury.  He notes that he started to run for exercise leading up to the onset of his symptoms.  The pain that he has to the left foot is primarily to the medial aspect.  The right foot is primarily to the lateral foot.  Pain is aggravated with running or walking without good arch support.  He denies any numbness, tingling or burning.  He has tried over-the-counter medications without significant relief of symptoms.  Review of Systems as detailed in HPI.   All others reviewed and are negative.   Objective: Vital Signs: Ht 5' 9.5" (1.765 m)   Wt 230 lb (104.3 kg)   BMI 33.48 kg/m   Physical Exam well-developed well-nourished gentleman in no acute distress.  Alert and oriented x3.  Ortho Exam examination of his left foot reveals moderate tenderness along the posterior tibial tendon just inferior to the medial malleolus.  No pain with resisted inversion or eversion.  No pain with plantar flexion or dorsiflexion.  No lateral tenderness.  No posterior tenderness.  Examination of the right foot shows mild tenderness along the dorsum of the fourth and fifth metatarsals.  No tenderness along the posterior tibial tendon or peroneal tendon.  Full active range of motion all planes.  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  Negative too many toes sign.  He is able to stand on his toes.  He is neurovascular intact distally.  Specialty Comments:  No specialty comments available.  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  Otherwise, no acute findings    PMFS History: Patient Active Problem List   Diagnosis  Date Noted  . Vitamin D deficiency 01/06/2019  . Chest pain 05/26/2018  . Obesity (BMI 30.0-34.9) 03/18/2018  . Depression   . Tattoo   . Former smoker   . Wheezing   . Acute bronchitis   . Onychomycosis   . Hypokalemia 04/15/2016   Past Medical History:  Diagnosis Date  . Anxiety   . Depression age 89 - 58  . Fatty liver   . Sepsis ([place]) 03/2016   presumed sepsis following months of respiratory symptoms felt secondary to mold in living quarters    Family History  Problem Relation Age of Onset  . Osteoarthritis Mother   . Blindness Sister   . Hypothyroidism Brother   . Hypothyroidism Sister   . Asthma Neg Hx   . Allergic Disorder Neg Hx     History reviewed. No pertinent  surgical history. Social History   Occupational History  . Occupation: Doctor, general practice at [employer]  . Smoking status: Former Smoker    Packs/day: 0.50    Years: 17.00    Pack years: 8.50    Types: Cigarettes  . Smokeless tobacco: Never Used  Substance and Sexual Activity  . Alcohol use: Yes    Comment: twice weekly  . Drug use: Not Currently    Types: Marijuana    Comment: MJ in teens only  . Sexual activity: Yes

## 2019-05-09 ENCOUNTER — Ambulatory Visit: Payer: Self-pay | Admitting: Orthopaedic Surgery

## 2019-05-12 ENCOUNTER — Other Ambulatory Visit: Payer: Self-pay

## 2019-05-12 ENCOUNTER — Telehealth (INDEPENDENT_AMBULATORY_CARE_PROVIDER_SITE_OTHER): Payer: Self-pay | Admitting: Internal Medicine

## 2019-05-12 DIAGNOSIS — R140 Abdominal distension (gaseous): Secondary | ICD-10-CM

## 2019-05-12 NOTE — Progress Notes (Signed)
Virtual Visit via Video Note  I connected with Jared Hunt on 05/12/19 at 11:45 AM EST by a video enabled telemedicine application and verified that I am speaking with the correct person using two identifiers.  Location patient: home Location provider: work office Persons participating in the virtual visit: patient, provider  I discussed the limitations of evaluation and management by telemedicine and the availability of in person appointments. The patient expressed understanding and agreed to proceed.   HPI: He has scheduled this acute visit to discuss some abdominal issues.  He has had bloating of the right side of his abdomen for about 2 weeks, he has had some nausea but no vomiting, he has had some diarrhea especially after eating fatty foods and nuts.  He denies any acid reflux symptoms or sour taste in his mouth, denies any fever, no known Covid exposures.   ROS: Constitutional: Denies fever, chills, diaphoresis, appetite change and fatigue.  HEENT: Denies photophobia, eye pain, redness, hearing loss, ear pain, congestion, sore throat, rhinorrhea, sneezing, mouth sores, trouble swallowing, neck pain, neck stiffness and tinnitus.   Respiratory: Denies SOB, DOE, cough, chest tightness,  and wheezing.   Cardiovascular: Denies chest pain, palpitations and leg swelling.  Gastrointestinal: Denies nausea, vomiting, abdominal pain, diarrhea, constipation, blood in stool and abdominal distention.  Genitourinary: Denies dysuria, urgency, frequency, hematuria, flank pain and difficulty urinating.  Endocrine: Denies: hot or cold intolerance, sweats, changes in hair or nails, polyuria, polydipsia. Musculoskeletal: Denies myalgias, back pain, joint swelling, arthralgias and gait problem.  Skin: Denies pallor, rash and wound.  Neurological: Denies dizziness, seizures, syncope, weakness, light-headedness, numbness and headaches.  Hematological: Denies adenopathy. Easy bruising, personal  or family bleeding history  Psychiatric/Behavioral: Denies suicidal ideation, mood changes, confusion, nervousness, sleep disturbance and agitation   Past Medical History:  Diagnosis Date  . Anxiety   . Depression age 39 - 22  . Fatty liver   . Sepsis ([place]) 03/2016   presumed sepsis following months of respiratory symptoms felt secondary to mold in living quarters    No past surgical history on file.  Family History  Problem Relation Age of Onset  . Osteoarthritis Mother   . Blindness Sister   . Hypothyroidism Brother   . Hypothyroidism Sister   . Asthma Neg Hx   . Allergic Disorder Neg Hx     SOCIAL HX:   reports that he has quit smoking. His smoking use included cigarettes. He has a 8.50 pack-year smoking history. He has never used smokeless tobacco. He reports current alcohol use. He reports previous drug use. Drug: Marijuana.   Current Outpatient Medications:  .  diclofenac sodium (VOLTAREN) 1 % GEL, Apply 2 g topically 4 (four) times daily as needed., Disp: 150 g, Rfl: 2 .  EPINEPHrine 0.3 mg/0.3 mL IJ SOAJ injection, INJECT CONTENTS OF 1 PEN INTO THE MUSCLE ONCE PRN, Disp: , Rfl:  .  fexofenadine (ALLEGRA) 180 MG tablet, Take 180 mg by mouth daily as needed for allergies or rhinitis., Disp: , Rfl:  .  LYCOPENE PO, Take by mouth., Disp: , Rfl:  .  Pomegranate, Punica granatum, (POMEGRANATE PO), Take by mouth., Disp: , Rfl:  .  RESVERATROL PO, Take by mouth., Disp: , Rfl:   EXAM:   VITALS per patient if applicable: None reported  GENERAL: alert, oriented, appears well and in no acute distress  HEENT: atraumatic, conjunttiva clear, no obvious abnormalities on inspection of external nose and ears  NECK: normal movements of the  head and neck  LUNGS: on inspection no signs of respiratory distress, breathing rate appears normal, no obvious gross increased work of breathing, gasping or wheezing  CV: no obvious cyanosis  MS: moves all visible extremities without  noticeable abnormality  PSYCH/NEURO: pleasant and cooperative, no obvious depression or anxiety, speech and thought processing grossly intact  ASSESSMENT AND PLAN:   Postprandial abdominal bloating -Etiology not entirely clear.  Location of pain not typical for cholelithiasis/choledocholithiasis or even peptic ulcer disease/GERD.  Could possibly be a mild case of viral gastroenteritis.  He has noticed a significant increase in symptoms with eating nuts.  He will keep a food journal and monitor symptoms over the next 2 weeks and contact me if no improvement.    I discussed the assessment and treatment plan with the patient. The patient was provided an opportunity to ask questions and all were answered. The patient agreed with the plan and demonstrated an understanding of the instructions.   The patient was advised to call back or seek an in-person evaluation if the symptoms worsen or if the condition fails to improve as anticipated.    Lelon Frohlich, MD  [facility] at [facility]

## 2019-06-23 ENCOUNTER — Other Ambulatory Visit: Payer: Self-pay

## 2019-06-26 ENCOUNTER — Encounter: Payer: Self-pay | Admitting: Family Medicine

## 2019-06-26 ENCOUNTER — Ambulatory Visit (INDEPENDENT_AMBULATORY_CARE_PROVIDER_SITE_OTHER): Payer: Self-pay | Admitting: Family Medicine

## 2019-06-26 ENCOUNTER — Other Ambulatory Visit: Payer: Self-pay

## 2019-06-26 VITALS — BP 128/70 | HR 87 | Temp 97.2°F | Ht 69.5 in | Wt 227.3 lb

## 2019-06-26 DIAGNOSIS — L819 Disorder of pigmentation, unspecified: Secondary | ICD-10-CM

## 2019-06-26 DIAGNOSIS — H9201 Otalgia, right ear: Secondary | ICD-10-CM

## 2019-06-26 DIAGNOSIS — K760 Fatty (change of) liver, not elsewhere classified: Secondary | ICD-10-CM

## 2019-06-26 LAB — COMPREHENSIVE METABOLIC PANEL
ALT: 38 U/L (ref 0–53)
AST: 25 U/L (ref 0–37)
Albumin: 4.4 g/dL (ref 3.5–5.2)
Alkaline Phosphatase: 58 U/L (ref 39–117)
BUN: 10 mg/dL (ref 6–23)
CO2: 29 mEq/L (ref 19–32)
Calcium: 9.3 mg/dL (ref 8.4–10.5)
Chloride: 101 mEq/L (ref 96–112)
Creatinine, Ser: 0.95 mg/dL (ref 0.40–1.50)
GFR: 91.44 mL/min (ref >60.00)
Glucose, Bld: 80 mg/dL (ref 70–99)
Potassium: 4.1 mEq/L (ref 3.5–5.1)
Sodium: 137 mEq/L (ref 135–145)
Total Bilirubin: 0.5 mg/dL (ref 0.2–1.2)
Total Protein: 7 g/dL (ref 6.0–8.3)

## 2019-06-26 NOTE — Progress Notes (Signed)
Subjective:     Patient ID: Jared Hunt, male   DOB: [birth year], 34 y.o.   MRN: [id number]  HPI [name] is seen today with concerns of both hands.  He states that he has occasional sharp pains in both hands and he has noted that some of the superficial veins in his fingertips seem to be more visible.  He has occasional mild numbness in the ring and fifth digits.  He initially expressed concern about carpal tunnel syndrome.  He does not have any hand or wrist pain at night.  He does not relate significant involvement of the thumb index or middle fingers.  He does spend a lot of time a computer and also play several instruments.  He has never noted any swelling of the upper extremities.  No weakness.  No neck pain.  Used to smoke but quit about 4 years ago.  He states he is specifically concerned about a circulatory or "vascular problem" for his upper extremities.  He had recent B12 level that was normal.  No history of described Raynaud's phenomenon.  Does have history of fatty liver by ultrasound 2019.  He tried to make some positive dietary changes.  Takes several supplements and is asking for his liver to be rechecked today.  He had full set of labs including comprehensive metabolic panel back in August which was normal.  Other issue is he states he has a sensation of "heat" right inner ear.  This comes and goes.  No clear provoking factors.  No ear pain.  No dizziness.  No ear discharge.  No rashes around the ear  Past Medical History:  Diagnosis Date  . Anxiety   . Depression age 41 - 72  . Fatty liver   . Sepsis ([place]) 03/2016   presumed sepsis following months of respiratory symptoms felt secondary to mold in living quarters   History reviewed. No pertinent surgical history.  reports that he has quit smoking. His smoking use included cigarettes. He has a 8.50 pack-year smoking history. He has never used smokeless tobacco. He reports current alcohol use. He reports previous drug use.  Drug: Marijuana. family history includes Blindness in his sister; Hypothyroidism in his brother and sister; Osteoarthritis in his mother. Allergies  Allergen Reactions  . Sulfa Antibiotics Anaphylaxis     Review of Systems  Constitutional: Negative for chills and fever.  Respiratory: Negative for shortness of breath.   Cardiovascular: Negative for chest pain.  Musculoskeletal: Negative for back pain and neck pain.  Neurological: Negative for weakness.  Hematological: Negative for adenopathy. Does not bruise/bleed easily.       Objective:   Physical Exam Vitals reviewed.  Constitutional:      Appearance: Normal appearance.  HENT:     Right Ear: Tympanic membrane and ear canal normal.     Left Ear: Tympanic membrane and ear canal normal.  Cardiovascular:     Rate and Rhythm: Normal rate and regular rhythm.     Heart sounds: No murmur.  Pulmonary:     Effort: Pulmonary effort is normal.     Breath sounds: Normal breath sounds.  Skin:    Comments: Slightly mottled appearance of the hands and this blanches with pressure.  He does not have any significant dilated veins.  Hands reveal good capillary refill on are warm to touch.  He has some very superficial mild telangiectasias on magnification.  Skin is not particularly dry.  No pustules.  No vesicles.  Neurological:  Mental Status: He is alert.     Comments: Full strength upper extremities.  Normal sensory function to touch.        Assessment:     #1 vague symptoms of intermittent mild discomfort fingertips but more than anything he is concerned about mottled appearance of hands.  We explained that he does not have any evidence to suggest major vascular compromise either from venous obstruction or arterial compromise.  He is not describing typical carpal tunnel symptoms  #2 abnormal sensation right ear with totally normal exam.  Etiology unclear  #3 history of fatty liver- patient had some questions regarding this that we  did her best to answer    Plan:     -Reassurance regarding vascular flow to upper extremities.  His symptoms did not sound typical for carpal tunnel.  We reviewed signs and symptoms of arterial compromise of upper extremities.  He is not describing Raynaud's phenomenon.  -Discussed clinical implications of fatty liver.  He is encouraged to continue weight loss and low saturated fat diet  -He does take several over-the-counter supplements and is asking for chemistry profile including liver.  We agreed to go and check this though he had normal labs back in August  -Continue close follow-up with primary with at least yearly physical  Eulas Post MD [facility] at [facility]

## 2019-07-25 ENCOUNTER — Ambulatory Visit: Payer: Self-pay | Admitting: Internal Medicine

## 2019-09-07 ENCOUNTER — Ambulatory Visit: Payer: Self-pay | Admitting: Internal Medicine

## 2019-09-07 DIAGNOSIS — Z0289 Encounter for other administrative examinations: Secondary | ICD-10-CM

## 2019-10-18 ENCOUNTER — Ambulatory Visit: Payer: 59 | Admitting: Family Medicine

## 2019-12-31 ENCOUNTER — Emergency Department (HOSPITAL_COMMUNITY): Payer: Self-pay

## 2019-12-31 ENCOUNTER — Other Ambulatory Visit: Payer: Self-pay

## 2019-12-31 ENCOUNTER — Encounter (HOSPITAL_COMMUNITY): Payer: Self-pay

## 2019-12-31 ENCOUNTER — Emergency Department (HOSPITAL_COMMUNITY)
Admission: EM | Admit: 2019-12-31 | Discharge: 2019-12-31 | Disposition: A | Discharge status: Home / Self Care | Payer: Self-pay | Attending: Emergency Medicine | Admitting: Emergency Medicine

## 2019-12-31 DIAGNOSIS — J40 Bronchitis, not specified as acute or chronic: Secondary | ICD-10-CM | POA: Insufficient documentation

## 2019-12-31 DIAGNOSIS — F17210 Nicotine dependence, cigarettes, uncomplicated: Secondary | ICD-10-CM | POA: Insufficient documentation

## 2019-12-31 DIAGNOSIS — Z20822 Contact with and (suspected) exposure to covid-19: Secondary | ICD-10-CM | POA: Insufficient documentation

## 2019-12-31 DIAGNOSIS — R0600 Dyspnea, unspecified: Secondary | ICD-10-CM | POA: Insufficient documentation

## 2019-12-31 MED ORDER — ALBUTEROL SULFATE HFA 108 (90 BASE) MCG/ACT IN AERS: 1.0000 | INHALATION_SPRAY | Freq: Four times a day (QID) | RESPIRATORY_TRACT | 0 refills | Status: DC | PRN

## 2019-12-31 MED ORDER — ALBUTEROL SULFATE HFA 108 (90 BASE) MCG/ACT IN AERS: 2.0000 | INHALATION_SPRAY | Freq: Once | RESPIRATORY_TRACT | Status: DC

## 2019-12-31 MED ORDER — PREDNISONE 10 MG PO TABS
50.0000 mg | ORAL_TABLET | Freq: Every day | ORAL | 0 refills | Status: DC
Start: 2019-12-31 — End: 2020-01-05

## 2019-12-31 NOTE — ED Provider Notes (Signed)
[facility] DEPT Provider Note   CSN: [id number] Arrival date & time: 12/31/19  0945     History Chief Complaint  Patient presents with  . 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  HPI     34 year old male comes in a chief complaint of shortness of breath. Patient reports that he has history of sepsis secondary to mold exposure (lung injury), heavy smoking, recent heavy use of CBD vaping.  He has been feeling short of breath for the last 2 or 3 weeks.  The symptoms are present with exertion and often even at rest.  No associated orthopnea, PND.  Patient has been living more with his girlfriend and they know that she has some mold issues in the apartment.  He has not used an inhaler at home.  He denies any new cough, wheezing.  No chest pain.  Patient denies any COVID-19 exposures and reports negative test results at home.  Past Medical History:  Diagnosis Date  . Anxiety   . Depression age 64 - 6  . Fatty liver   . Sepsis ([place]) 03/2016   presumed sepsis following months of respiratory symptoms felt secondary to mold in living quarters    Patient Active Problem List   Diagnosis Date Noted  . Vitamin D deficiency 01/06/2019  . Chest pain 05/26/2018  . Obesity (BMI 30.0-34.9) 03/18/2018  . Depression   . Tattoo   . Former smoker   . Wheezing   . Acute bronchitis   . Onychomycosis   . Hypokalemia 04/15/2016    History reviewed. No pertinent surgical history.     Family History  Problem Relation Age of Onset  . Osteoarthritis Mother   . Blindness Sister   . Hypothyroidism Brother   . Hypothyroidism Sister   . Asthma Neg Hx   . Allergic Disorder Neg Hx     Social History   Tobacco Use  . Smoking status: Current Every Day Smoker    Packs/day: 0.50    Years: 17.00    Pack years: 8.50    Types: Cigarettes  . Smokeless tobacco: Never Used  . Tobacco comment: CBD cigarettes  Vaping Use  . Vaping Use: Never  used  Substance Use Topics  . Alcohol use: Yes    Comment: twice weekly  . Drug use: Not Currently    Types: Marijuana    Comment: MJ in teens only    Home Medications Prior to Admission medications   Medication Sig Start Date End Date Taking? Authorizing Provider  albuterol (VENTOLIN HFA) 108 (90 Base) MCG/ACT inhaler Inhale 1-2 puffs into the lungs every 6 (six) hours as needed for wheezing or shortness of breath. 12/31/19   Varney Biles, MD  diclofenac sodium (VOLTAREN) 1 % GEL Apply 2 g topically 4 (four) times daily as needed. 03/09/19   Aundra Dubin, PA-C  EPINEPHrine 0.3 mg/0.3 mL IJ SOAJ injection INJECT CONTENTS OF 1 PEN INTO THE MUSCLE ONCE PRN 12/02/18   [provider]  fexofenadine (ALLEGRA) 180 MG tablet Take 180 mg by mouth daily as needed for allergies or rhinitis.    [provider]  LYCOPENE PO Take by mouth.    [provider]  Pomegranate, Punica granatum, (POMEGRANATE PO) Take by mouth.    [provider]  predniSONE (DELTASONE) 10 MG tablet Take 5 tablets (50 mg total) by mouth daily. 12/31/19   Varney Biles, MD  RESVERATROL PO Take by mouth.  [provider]    Allergies    Sulfa antibiotics  Review of Systems   Review of Systems  Constitutional: Positive for activity change. Negative for fever.  Respiratory: Positive for shortness of breath.   Cardiovascular: Negative for chest pain.  Gastrointestinal: Negative for nausea and vomiting.  Allergic/Immunologic: Positive for environmental allergies. Negative for immunocompromised state.  All other systems reviewed and are negative.   Physical Exam Updated Vital Signs BP (!) 146/92 (BP Location: Left Arm)   Pulse 90   Temp 97.6 F (36.4 C) (Oral)   Resp 16   Ht 5' 10"  (1.778 m)   Wt 104.3 kg   SpO2 97%   BMI 33.00 kg/m   Physical Exam Vitals and nursing note reviewed.  Constitutional:      Appearance: He is well-developed.  HENT:     Head:  Atraumatic.  Cardiovascular:     Rate and Rhythm: Normal rate.  Pulmonary:     Effort: Pulmonary effort is normal. No tachypnea.     Breath sounds: No stridor. No decreased breath sounds or wheezing.  Musculoskeletal:     Cervical back: Neck supple.  Skin:    General: Skin is warm.  Neurological:     Mental Status: He is alert and oriented to person, place, and time.     ED Results / Procedures / Treatments   Labs (all labs ordered are listed, but only abnormal results are displayed) Labs Reviewed  SARS CORONAVIRUS 2 (TAT 6-24 HRS)    EKG EKG Interpretation  Date/Time:  Sunday December 31 2019 09:58:51 EDT Ventricular Rate:  83 PR Interval:    QRS Duration: 91 QT Interval:  359 QTC Calculation: 422 R Axis:   80 Text Interpretation: Sinus rhythm 12 Lead; [place]ar since last tracing no significant change Confirmed by Wentz, Elliott ([id number]) on 12/31/2019 1:35:11 PM   Radiology DG Chest 2 View  Result Date: 12/31/2019 CLINICAL DATA:  Shortness of breath. Additional history provided: Patient reports shortness of breath for 3 weeks. Chest tightness, dry cough. EXAM: CHEST - 2 VIEW COMPARISON:  Prior chest radiograph 02/23/2018. FINDINGS: Heart size within normal limits. No appreciable airspace consolidation. No evidence of pleural effusion or pneumothorax. No acute bony abnormality identified. IMPRESSION: No evidence of active cardiopulmonary disease. Electronically Signed   By: Kyle  Golden DO   On: 12/31/2019 10:32    Procedures Procedures (including critical care time)  Medications Ordered in ED Medications - No data to display  ED Course  I have reviewed the triage vital signs and the nursing notes.  Pertinent labs & imaging results that were available during my care of the patient were reviewed by me and considered in my medical decision making (see chart for details).    MDM Rules/Calculators/A&P                          34  year old male comes in a chief  complaint of shortness of breath.  He has history of mold exposure that resulted in respiratory sepsis.  He also has history of heavy smoking and recently been vaping CBD oil.  He is concerned for pneumonia, especially given the prior history of sepsis.  On exam there is no acute distress or significant abnormalities.  Chest x-ray does not reveal any evidence of pneumonia.  Differential diagnosis includes PE, however patient has well score of 0 and is PERC negative.  Doubt PE because of it.  Other possibility includes bronchiolitis or alveolar  injury because of toxin exposure that is not being picked up on the chest x-ray.  He has a PCP and is comfortable following up with them in 7 to 10 days, they can order an outpatient CT scan if needed or sent patient to pulmonologist/perform further respiratory vital capacity testing.  Patient has agreed with outpatient Covid test.  He will start taking prednisone and also perform pulmonary toilet with albuterol at home.  Strict ER return precautions have been discussed, and patient is agreeing with the plan and is comfortable with the workup done and the recommendations from the ER.   Nimai Burbach Lapre was evaluated in Emergency Department on 12/31/2019 for the symptoms described in the history of present illness. He was evaluated in the context of the global COVID-19 pandemic, which necessitated consideration that the patient might be at risk for infection with the SARS-CoV-2 virus that causes COVID-19. Institutional protocols and algorithms that pertain to the evaluation of patients at risk for COVID-19 are in a state of rapid change based on information released by regulatory bodies including the CDC and federal and state organizations. These policies and algorithms were followed during the patient's care in the ED.   Final Clinical Impression(s) / ED Diagnoses Final diagnoses:  Bronchitis  Dyspnea, unspecified type    Rx / DC Orders ED Discharge  Orders         Ordered    predniSONE (DELTASONE) 10 MG tablet  Daily     Discontinue  Reprint     12/31/19 1337    albuterol (VENTOLIN HFA) 108 (90 Base) MCG/ACT inhaler  Every 6 hours PRN     Discontinue  Reprint     12/31/19 [place], Mikah Poss, MD 12/31/19 1345

## 2019-12-31 NOTE — Discharge Instructions (Signed)
We saw in the ER for shortness of breath.  The x-ray is not concerning for pneumonia.  We are unsure what the underlying causes.  We are sending you home with prednisone and albuterol inhaler.  Outpatient Covid test has been ordered, the results will come back tomorrow.  For now we recommend that you take the medications as prescribed and follow-up with your doctor in 7 to 10 days if not getting better.  Instruction on chemical inhalation injury also provided.  Return to the ER if you start having severe shortness of breath, severe cough, bloody phlegm, high fevers.

## 2019-12-31 NOTE — ED Triage Notes (Signed)
Patient c/o SOB x 3 weeks, Patient states he smokes CBD cigarettes which he smokes for pain control. Patient states he has a non productive cough. Patient states he has an intermitent burning feeling in the right top of his chest.

## 2020-01-01 LAB — SARS CORONAVIRUS 2 (TAT 6-24 HRS): SARS Coronavirus 2: NEGATIVE

## 2020-01-02 ENCOUNTER — Ambulatory Visit: Payer: Self-pay | Admitting: Internal Medicine

## 2020-01-05 ENCOUNTER — Other Ambulatory Visit: Payer: Self-pay

## 2020-01-05 ENCOUNTER — Encounter: Payer: Self-pay | Admitting: Internal Medicine

## 2020-01-05 ENCOUNTER — Ambulatory Visit (INDEPENDENT_AMBULATORY_CARE_PROVIDER_SITE_OTHER): Payer: Self-pay | Admitting: Internal Medicine

## 2020-01-05 VITALS — BP 120/90 | HR 71 | Temp 98.1°F | Wt 246.3 lb

## 2020-01-05 DIAGNOSIS — K7581 Nonalcoholic steatohepatitis (NASH): Secondary | ICD-10-CM

## 2020-01-05 DIAGNOSIS — E559 Vitamin D deficiency, unspecified: Secondary | ICD-10-CM

## 2020-01-05 DIAGNOSIS — Z77120 Contact with and (suspected) exposure to mold (toxic): Secondary | ICD-10-CM

## 2020-01-05 DIAGNOSIS — R7302 Impaired glucose tolerance (oral): Secondary | ICD-10-CM

## 2020-01-05 DIAGNOSIS — E669 Obesity, unspecified: Secondary | ICD-10-CM

## 2020-01-05 DIAGNOSIS — F329 Major depressive disorder, single episode, unspecified: Secondary | ICD-10-CM

## 2020-01-05 DIAGNOSIS — R0602 Shortness of breath: Secondary | ICD-10-CM

## 2020-01-05 DIAGNOSIS — Z87891 Personal history of nicotine dependence: Secondary | ICD-10-CM

## 2020-01-05 DIAGNOSIS — F32A Depression, unspecified: Secondary | ICD-10-CM

## 2020-01-05 DIAGNOSIS — Z09 Encounter for follow-up examination after completed treatment for conditions other than malignant neoplasm: Secondary | ICD-10-CM

## 2020-01-05 NOTE — Patient Instructions (Signed)
-  Nice seeing you today!!  -Lab work today; will notify you once results are available.  -Referrals to allergy and pulmonary today.

## 2020-01-05 NOTE — Progress Notes (Signed)
Established Patient Office Visit     This visit occurred during the SARS-CoV-2 public health emergency.  Safety protocols were in place, including screening questions prior to the visit, additional usage of staff PPE, and extensive cleaning of exam room while observing appropriate contact time as indicated for disinfecting solutions.    CC/Reason for Visit: ED follow-up  HPI: Jared Hunt is a 34 y.o. male who is coming in today for the above mentioned reasons.  He was seen in the emergency department on 12/31/2019 due to shortness of breath.  Covid test was negative.  Chest x-ray was negative, low probability for PE so CT angiogram of the chest was not performed.  He has a prior history of sepsis secondary to mold, he has been living with his girlfriend who has mold in her apartment and he wonders if this might have been the cause.  He also has recently started smoking CBD cigarettes and thinks this may have played a role.  He still gets a little winded with exercise but otherwise feels improved.  He is a former cigarette smoker.  His past medical history is also significant for obesity, vitamin D deficiency, impaired glucose tolerance, hyperlipidemia and nonalcoholic steatohepatitis.  He is requesting lab work today.   Past Medical/Surgical History: Past Medical History:  Diagnosis Date  . Anxiety   . Depression age 70 - 37  . Fatty liver   . Sepsis ([place]) 03/2016   presumed sepsis following months of respiratory symptoms felt secondary to mold in living quarters    No past surgical history on file.  Social History:  reports that he has quit smoking. His smoking use included cigarettes. He has a 8.50 pack-year smoking history. He has never used smokeless tobacco. He reports current alcohol use. He reports previous drug use. Drug: Marijuana.  Allergies: Allergies  Allergen Reactions  . Sulfa Antibiotics Anaphylaxis    Family History:  Family History  Problem Relation  Age of Onset  . Osteoarthritis Mother   . Blindness Sister   . Hypothyroidism Brother   . Hypothyroidism Sister   . Asthma Neg Hx   . Allergic Disorder Neg Hx      Current Outpatient Medications:  .  albuterol (VENTOLIN HFA) 108 (90 Base) MCG/ACT inhaler, Inhale 1-2 puffs into the lungs every 6 (six) hours as needed for wheezing or shortness of breath., Disp: 18 g, Rfl: 0 .  diclofenac sodium (VOLTAREN) 1 % GEL, Apply 2 g topically 4 (four) times daily as needed., Disp: 150 g, Rfl: 2 .  EPINEPHrine 0.3 mg/0.3 mL IJ SOAJ injection, INJECT CONTENTS OF 1 PEN INTO THE MUSCLE ONCE PRN, Disp: , Rfl:  .  fexofenadine (ALLEGRA) 180 MG tablet, Take 180 mg by mouth daily as needed for allergies or rhinitis., Disp: , Rfl:  .  LYCOPENE PO, Take by mouth., Disp: , Rfl:  .  Pomegranate, Punica granatum, (POMEGRANATE PO), Take by mouth., Disp: , Rfl:  .  RESVERATROL PO, Take by mouth., Disp: , Rfl:   Review of Systems:  Constitutional: Denies fever, chills, diaphoresis, appetite change and fatigue.  HEENT: Denies photophobia, eye pain, redness, hearing loss, ear pain, congestion, sore throat, rhinorrhea, sneezing, mouth sores, trouble swallowing, neck pain, neck stiffness and tinnitus.   Respiratory: Denies  cough, chest tightness,  and wheezing.   Cardiovascular: Denies chest pain, palpitations and leg swelling.  Gastrointestinal: Denies nausea, vomiting, abdominal pain, diarrhea, constipation, blood in stool and abdominal distention.  Genitourinary: Denies dysuria,  urgency, frequency, hematuria, flank pain and difficulty urinating.  Endocrine: Denies: hot or cold intolerance, sweats, changes in hair or nails, polyuria, polydipsia. Musculoskeletal: Denies myalgias, back pain, joint swelling, arthralgias and gait problem.  Skin: Denies pallor, rash and wound.  Neurological: Denies dizziness, seizures, syncope, weakness, light-headedness, numbness and headaches.  Hematological: Denies adenopathy. Easy  bruising, personal or family bleeding history  Psychiatric/Behavioral: Denies suicidal ideation, mood changes, confusion, nervousness, sleep disturbance and agitation    Physical Exam: Vitals:   01/05/20 1014  BP: 120/90  Pulse: 71  Temp: 98.1 F (36.7 C)  TempSrc: Oral  SpO2: 97%  Weight: 246 lb 4.8 oz (111.7 kg)    Body mass index is 35.34 kg/m.   Constitutional: NAD, calm, comfortable Eyes: PERRL, lids and conjunctivae normal, wears corrective lenses ENMT: Mucous membranes are moist. Respiratory: clear to auscultation bilaterally, no wheezing, no crackles. Normal respiratory effort. No accessory muscle use.  Cardiovascular: Regular rate and rhythm, no murmurs / rubs / gallops. No extremity edema.  Neurologic: Grossly intact and nonfocal Psychiatric: Normal judgment and insight. Alert and oriented x 3. Normal mood.    Impression and Plan:  Hospital discharge follow-up  Vitamin D deficiency  - Plan: VITAMIN D 25 Hydroxy (Vit-D Deficiency, Fractures)  Depression, unspecified depression type -Mood is stable, not on medications.  Former smoker  Obesity (BMI 30.0-34.9) -Discussed healthy lifestyle, including increased physical activity and better food choices to promote weight loss.  IGT (impaired glucose tolerance)  -Last A1c was 5.5 in August 2020, recheck today.  NASH (nonalcoholic steatohepatitis)  - Plan: Comprehensive metabolic panel, Lipid panel -Suspect LFTs have worsened with weight gain.    Patient Instructions  -Nice seeing you today!!  -Lab work today; will notify you once results are available.  -Referrals to allergy and pulmonary today.     Lelon Frohlich, MD [facility]

## 2020-01-05 NOTE — Addendum Note (Signed)
Addended by: Westley Hummer B on: 01/05/2020 11:07 AM   Modules accepted: Orders

## 2020-01-06 LAB — CBC WITH DIFFERENTIAL/PLATELET
Absolute Monocytes: 1017 cells/uL — ABNORMAL HIGH (ref 200–950)
Basophils Absolute: 62 cells/uL (ref 0–200)
Basophils Relative: 0.5 %
Eosinophils Absolute: 112 cells/uL (ref 15–500)
Eosinophils Relative: 0.9 %
HCT: 45.2 % (ref 38.5–50.0)
Hemoglobin: 15.7 g/dL (ref 13.2–17.1)
Lymphs Abs: 4613 cells/uL — ABNORMAL HIGH (ref 850–3900)
MCH: 31 pg (ref 27.0–33.0)
MCHC: 34.7 g/dL (ref 32.0–36.0)
MCV: 89.3 fL (ref 80.0–100.0)
MPV: 10.7 fL (ref 7.5–12.5)
Monocytes Relative: 8.2 %
Neutro Abs: 6597 cells/uL (ref 1500–7800)
Neutrophils Relative %: 53.2 %
Platelets: 266 10*3/uL (ref 140–400)
RBC: 5.06 10*6/uL (ref 4.20–5.80)
RDW: 12.6 % (ref 11.0–15.0)
Total Lymphocyte: 37.2 %
WBC: 12.4 10*3/uL — ABNORMAL HIGH (ref 3.8–10.8)

## 2020-01-06 LAB — COMPREHENSIVE METABOLIC PANEL
AG Ratio: 1.8 (calc) (ref 1.0–2.5)
ALT: 17 U/L (ref 9–46)
AST: 12 U/L (ref 10–40)
Albumin: 4.2 g/dL (ref 3.6–5.1)
Alkaline phosphatase (APISO): 40 U/L (ref 36–130)
BUN: 17 mg/dL (ref 7–25)
CO2: 28 mmol/L (ref 20–32)
Calcium: 9.1 mg/dL (ref 8.6–10.3)
Chloride: 104 mmol/L (ref 98–110)
Creat: 1.11 mg/dL (ref 0.60–1.35)
Globulin: 2.4 g/dL (calc) (ref 1.9–3.7)
Glucose, Bld: 82 mg/dL (ref 65–99)
Potassium: 3.9 mmol/L (ref 3.5–5.3)
Sodium: 139 mmol/L (ref 135–146)
Total Bilirubin: 0.5 mg/dL (ref 0.2–1.2)
Total Protein: 6.6 g/dL (ref 6.1–8.1)

## 2020-01-06 LAB — LIPID PANEL
Cholesterol: 194 mg/dL (ref <200)
HDL: 51 mg/dL (ref >=40)
LDL Cholesterol (Calc): 127 mg/dL (calc) — ABNORMAL HIGH
Non-HDL Cholesterol (Calc): 143 mg/dL (calc) — ABNORMAL HIGH (ref <130)
Total CHOL/HDL Ratio: 3.8 (calc) (ref <5.0)
Triglycerides: 65 mg/dL (ref <150)

## 2020-01-06 LAB — HEMOGLOBIN A1C
Hgb A1c MFr Bld: 5.6 % of total Hgb (ref <5.7)
Mean Plasma Glucose: 114 (calc)
eAG (mmol/L): 6.3 (calc)

## 2020-01-06 LAB — VITAMIN D 25 HYDROXY (VIT D DEFICIENCY, FRACTURES): Vit D, 25-Hydroxy: 21 ng/mL — ABNORMAL LOW (ref 30–100)

## 2020-01-08 ENCOUNTER — Telehealth: Payer: Self-pay | Admitting: Internal Medicine

## 2020-01-08 NOTE — Telephone Encounter (Signed)
The patient received his lab results today and was concerned about the results. He is around mold sometimes and wanting to know if he has an infection and if he needs to start eating something that is no fungal or is there antibiotics that needs to be called in for him.  Please advise

## 2020-01-10 ENCOUNTER — Encounter: Payer: Self-pay | Admitting: Internal Medicine

## 2020-01-10 ENCOUNTER — Other Ambulatory Visit: Payer: Self-pay | Admitting: Internal Medicine

## 2020-01-10 DIAGNOSIS — E559 Vitamin D deficiency, unspecified: Secondary | ICD-10-CM

## 2020-01-10 DIAGNOSIS — E785 Hyperlipidemia, unspecified: Secondary | ICD-10-CM | POA: Insufficient documentation

## 2020-01-10 MED ORDER — VITAMIN D (ERGOCALCIFEROL) 1.25 MG (50000 UNIT) PO CAPS: 50000.0000 [IU] | ORAL_CAPSULE | ORAL | 0 refills | Status: AC

## 2020-01-11 ENCOUNTER — Other Ambulatory Visit: Payer: Self-pay | Admitting: Internal Medicine

## 2020-01-11 DIAGNOSIS — E559 Vitamin D deficiency, unspecified: Secondary | ICD-10-CM

## 2020-01-24 ENCOUNTER — Other Ambulatory Visit: Payer: Self-pay

## 2020-01-25 ENCOUNTER — Encounter: Payer: Self-pay | Admitting: Internal Medicine

## 2020-02-02 ENCOUNTER — Ambulatory Visit: Payer: Self-pay | Admitting: Allergy

## 2020-02-12 ENCOUNTER — Encounter (HOSPITAL_COMMUNITY): Payer: Self-pay

## 2020-02-12 ENCOUNTER — Other Ambulatory Visit: Payer: Self-pay

## 2020-02-12 ENCOUNTER — Emergency Department (HOSPITAL_COMMUNITY)
Admission: EM | Admit: 2020-02-12 | Discharge: 2020-02-12 | Disposition: A | Discharge status: Home / Self Care | Payer: 59 | Attending: Emergency Medicine | Admitting: Emergency Medicine

## 2020-02-12 DIAGNOSIS — Z87891 Personal history of nicotine dependence: Secondary | ICD-10-CM | POA: Insufficient documentation

## 2020-02-12 DIAGNOSIS — T781XXA Other adverse food reactions, not elsewhere classified, initial encounter: Secondary | ICD-10-CM | POA: Insufficient documentation

## 2020-02-12 DIAGNOSIS — L509 Urticaria, unspecified: Secondary | ICD-10-CM | POA: Diagnosis not present

## 2020-02-12 HISTORY — DX: Allergy to other foods: Z91.018

## 2020-02-12 MED ORDER — FAMOTIDINE 20 MG PO TABS
20.0000 mg | ORAL_TABLET | Freq: Once | ORAL | Status: AC
  Administered 2020-02-12: 20 mg via ORAL
  Filled 2020-02-12: qty 1

## 2020-02-12 MED ORDER — DEXAMETHASONE SODIUM PHOSPHATE 10 MG/ML IJ SOLN
10.0000 mg | Freq: Once | INTRAMUSCULAR | Status: AC
  Administered 2020-02-12: 10 mg via INTRAMUSCULAR
  Filled 2020-02-12: qty 1

## 2020-02-12 MED ORDER — DIPHENHYDRAMINE HCL 25 MG PO CAPS
50.0000 mg | ORAL_CAPSULE | Freq: Once | ORAL | Status: AC
  Administered 2020-02-12: 50 mg via ORAL
  Filled 2020-02-12: qty 2

## 2020-02-12 NOTE — ED Triage Notes (Signed)
Patient arrived stating he has Alpha gal and may have ingested something with red meat in it at 2pm and developed hives. Patient took a Benadryl. In no acute distress. No SOB

## 2020-02-12 NOTE — ED Provider Notes (Signed)
[facility] DEPT Provider Note: Georgena Spurling, MD, FACEP  CSN: [id number] MRN: [id number] ARRIVAL: 02/12/20 at [place]: [place]  Allergic Reaction   HISTORY OF PRESENT ILLNESS  02/12/20 2:03 AM Jared Hunt is a 34 y.o. male with galactose-alpha-1,3-galactose ("alpha gal ") allergy.  He ate some chicken chicken chicharrones about noon yesterday.  He does not know if they were cooked in oil also used for pork skins.  About 2 PM yesterday he developed hives, primarily at joint folds and in his groin and axillae.  Symptoms are mild to moderate.  He took 25 mg of Benadryl yesterday afternoon and again at 11:30 PM yesterday evening.  These made him sleepy but did not resolve the hives.  He denies any difficulty breathing, throat swelling, nausea, vomiting or diarrhea.   Past Medical History:  Diagnosis Date  . Allergy to alpha-gal   . Anxiety   . Depression age 9 - 63  . Fatty liver   . Sepsis ([place]) 03/2016   presumed sepsis following months of respiratory symptoms felt secondary to mold in living quarters    No past surgical history on file.  Family History  Problem Relation Age of Onset  . Osteoarthritis Mother   . Blindness Sister   . Hypothyroidism Brother   . Hypothyroidism Sister   . Asthma Neg Hx   . Allergic Disorder Neg Hx     Social History   Tobacco Use  . Smoking status: Former Smoker    Packs/day: 0.50    Years: 17.00    Pack years: 8.50    Types: Cigarettes  . Smokeless tobacco: Never Used  . Tobacco comment: CBD cigarettes  Vaping Use  . Vaping Use: Never used  Substance Use Topics  . Alcohol use: Yes    Comment: twice weekly  . Drug use: Not Currently    Types: Marijuana    Comment: MJ in teens only    Prior to Admission medications   Medication Sig Start Date End Date Taking? Authorizing Provider  albuterol (VENTOLIN HFA) 108 (90 Base) MCG/ACT inhaler Inhale 1-2 puffs into the lungs every 6 (six) hours as needed  for wheezing or shortness of breath. 12/31/19   Varney Biles, MD  diclofenac sodium (VOLTAREN) 1 % GEL Apply 2 g topically 4 (four) times daily as needed. 03/09/19   Aundra Dubin, PA-C  EPINEPHrine 0.3 mg/0.3 mL IJ SOAJ injection INJECT CONTENTS OF 1 PEN INTO THE MUSCLE ONCE PRN 12/02/18   [provider]  fexofenadine (ALLEGRA) 180 MG tablet Take 180 mg by mouth daily as needed for allergies or rhinitis.    [provider]  LYCOPENE PO Take by mouth.    [provider]  Pomegranate, Punica granatum, (POMEGRANATE PO) Take by mouth.    [provider]  RESVERATROL PO Take by mouth.    [provider]  Vitamin D, Ergocalciferol, (DRISDOL) 1.25 MG (50000 UNIT) CAPS capsule Take 1 capsule (50,000 Units total) by mouth every 7 (seven) days for 12 doses. 01/10/20 03/28/20  Erline Hau, MD    Allergies Sulfa antibiotics   REVIEW OF SYSTEMS  Negative except as noted here or in the History of Present Illness.   PHYSICAL EXAMINATION  Initial Vital Signs Blood pressure (!) 164/104, pulse 90, temperature 98 F (36.7 C), temperature source Oral, resp. rate 18, SpO2 98 %.  Examination General: Well-developed, well-nourished male in no acute distress; appearance consistent with age of record HENT:  normocephalic; atraumatic Eyes: pupils equal, round and reactive to light; extraocular muscles intact Neck: supple Heart: regular rate and rhythm Lungs: clear to auscultation bilaterally Abdomen: soft; nondistended; nontender; bowel sounds present Extremities: No deformity; full range of motion; pulses normal Neurologic: Awake, alert and oriented; motor function intact in all extremities and symmetric; no facial droop Skin: Warm and dry; urticarial rash primarily at joint folds and in his axillae; groin not examined Psychiatric: Normal mood and affect   RESULTS  Summary of this visit's results, reviewed and interpreted by myself:   EKG  Interpretation  Date/Time:    Ventricular Rate:    PR Interval:    QRS Duration:   QT Interval:    QTC Calculation:   R Axis:     Text Interpretation:        Laboratory Studies: No results found for this or any previous visit (from the past 24 hour(s)). Imaging Studies: No results found.  ED COURSE and MDM  Nursing notes, initial and subsequent vitals signs, including pulse oximetry, reviewed and interpreted by myself.  Vitals:   02/12/20 0005  BP: (!) 164/104  Pulse: 90  Resp: 18  Temp: 98 F (36.7 C)  TempSrc: Oral  SpO2: 98%   Medications  diphenhydrAMINE (BENADRYL) capsule 50 mg (50 mg Oral Given 02/12/20 0304)  famotidine (PEPCID) tablet 20 mg (20 mg Oral Given 02/12/20 0304)  dexamethasone (DECADRON) injection 10 mg (10 mg Intramuscular Given 02/12/20 0305)   4:19 AM Hives nearly resolved, patient ready to go home.  He was advised to continue Benadryl or a nonsedating over-the-counter antihistamine for hives were to return if difficulty breathing develops.  I suspect this allergic reaction was triggered by oil containing pork residue triggering an alpha gal reaction.  PROCEDURES  Procedures   ED DIAGNOSES     ICD-10-CM   1. 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 Urticaria  L50.9        Zaahir Pickney, MD 02/12/20 [id number]

## 2020-02-26 ENCOUNTER — Ambulatory Visit: Payer: Self-pay | Admitting: Pulmonary Disease

## 2020-03-19 ENCOUNTER — Other Ambulatory Visit: Payer: Self-pay

## 2020-03-19 ENCOUNTER — Ambulatory Visit (INDEPENDENT_AMBULATORY_CARE_PROVIDER_SITE_OTHER): Payer: 59 | Admitting: Internal Medicine

## 2020-03-19 VITALS — BP 102/68 | HR 73 | Temp 98.2°F | Wt 250.6 lb

## 2020-03-19 DIAGNOSIS — K760 Fatty (change of) liver, not elsewhere classified: Secondary | ICD-10-CM | POA: Diagnosis not present

## 2020-03-19 DIAGNOSIS — K5900 Constipation, unspecified: Secondary | ICD-10-CM | POA: Diagnosis not present

## 2020-03-19 DIAGNOSIS — E669 Obesity, unspecified: Secondary | ICD-10-CM | POA: Diagnosis not present

## 2020-03-19 DIAGNOSIS — R1012 Left upper quadrant pain: Secondary | ICD-10-CM

## 2020-03-19 NOTE — Patient Instructions (Addendum)
-  Nice seeing you today!!  -Lab work today; will notify you once results are available.  -Stop drinking alcohol. Increase fluid and fiber consumption. Take colace or miralax daily.

## 2020-03-19 NOTE — Progress Notes (Signed)
Acute office Visit     This visit occurred during the SARS-CoV-2 public health emergency.  Safety protocols were in place, including screening questions prior to the visit, additional usage of staff PPE, and extensive cleaning of exam room while observing appropriate contact time as indicated for disinfecting solutions.    CC/Reason for Visit: Left upper quadrant pain  HPI: Jared Hunt is a 34 y.o. male who is coming in today for the above mentioned reasons.  For the past few weeks he has been having pain of his left upper quadrant.  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  He does not drink frequently, maybe once every 2 weeks but when he does he binge drinks at least 6-8 beers.  He has been working out more.  He denies any fever, any nausea, any vomiting, denies any urinary symptoms such as dysuria or frequency.  1 month ago he had STD testing and was told everything was negative.  He has noticed that he has been more constipated.  He has a bowel movement about every other day but has noticed decreased volume of hard stool.   Past Medical/Surgical History: Past Medical History:  Diagnosis Date  . Allergy to alpha-gal   . Anxiety   . Depression age 43 - 40  . Fatty liver   . Sepsis ([place]) 03/2016   presumed sepsis following months of respiratory symptoms felt secondary to mold in living quarters    No past surgical history on file.  Social History:  reports that he has quit smoking. His smoking use included cigarettes. He has a 8.50 pack-year smoking history. He has never used smokeless tobacco. He reports current alcohol use. He reports previous drug use. Drug: Marijuana.  Allergies: Allergies  Allergen Reactions  . Sulfa Antibiotics Anaphylaxis    Family History:  Family History  Problem Relation Age of Onset  . Osteoarthritis Mother   . Blindness Sister   . Hypothyroidism Brother   . Hypothyroidism Sister   . Asthma Neg Hx   . Allergic  Disorder Neg Hx      Current Outpatient Medications:  .  albuterol (VENTOLIN HFA) 108 (90 Base) MCG/ACT inhaler, Inhale 1-2 puffs into the lungs every 6 (six) hours as needed for wheezing or shortness of breath., Disp: 18 g, Rfl: 0 .  diclofenac sodium (VOLTAREN) 1 % GEL, Apply 2 g topically 4 (four) times daily as needed., Disp: 150 g, Rfl: 2 .  EPINEPHrine 0.3 mg/0.3 mL IJ SOAJ injection, INJECT CONTENTS OF 1 PEN INTO THE MUSCLE ONCE PRN, Disp: , Rfl:  .  fexofenadine (ALLEGRA) 180 MG tablet, Take 180 mg by mouth daily as needed for allergies or rhinitis., Disp: , Rfl:  .  LYCOPENE PO, Take by mouth., Disp: , Rfl:  .  Pomegranate, Punica granatum, (POMEGRANATE PO), Take by mouth., Disp: , Rfl:  .  RESVERATROL PO, Take by mouth., Disp: , Rfl:  .  Vitamin D, Ergocalciferol, (DRISDOL) 1.25 MG (50000 UNIT) CAPS capsule, Take 1 capsule (50,000 Units total) by mouth every 7 (seven) days for 12 doses., Disp: 12 capsule, Rfl: 0  Review of Systems:  Constitutional: Denies fever, chills, diaphoresis, appetite change and fatigue.  HEENT: Denies photophobia, eye pain, redness, hearing loss, ear pain, congestion, sore throat, rhinorrhea, sneezing, mouth sores, trouble swallowing, neck pain, neck stiffness and tinnitus.   Respiratory: Denies SOB, DOE, cough, chest tightness,  and wheezing.   Cardiovascular: Denies chest pain, palpitations and leg  swelling.  Gastrointestinal: Denies nausea, vomiting, blood in stool and abdominal distention.  Genitourinary: Denies dysuria, urgency, frequency, hematuria, flank pain and difficulty urinating.  Endocrine: Denies: hot or cold intolerance, sweats, changes in hair or nails, polyuria, polydipsia. Musculoskeletal: Denies myalgias, back pain, joint swelling, arthralgias and gait problem.  Skin: Denies pallor, rash and wound.  Neurological: Denies dizziness, seizures, syncope, weakness, light-headedness, numbness and headaches.  Hematological: Denies adenopathy.  Easy bruising, personal or family bleeding history  Psychiatric/Behavioral: Denies suicidal ideation, mood changes, confusion, nervousness, sleep disturbance and agitation    Physical Exam: Vitals:   03/19/20 0949  BP: 102/68  Pulse: 73  Temp: 98.2 F (36.8 C)  TempSrc: Oral  SpO2: 97%  Weight: 250 lb 9.6 oz (113.7 kg)    Body mass index is 35.96 kg/m.   Constitutional: NAD, calm, comfortable Eyes: PERRL, lids and conjunctivae normal, wears corrective lenses ENMT: Mucous membranes are moist. Respiratory: clear to auscultation bilaterally, no wheezing, no crackles. Normal respiratory effort. No accessory muscle use.  Cardiovascular: Regular rate and rhythm, no murmurs / rubs / gallops. No extremity edema. Abdomen: Slight tenderness to deep palpation of the left upper quadrant, no masses palpated. No hepatosplenomegaly. Bowel sounds positive.  Neurologic: Grossly intact and nonfocal. Psychiatric: Normal judgment and insight. Alert and oriented x 3. Normal mood.    Impression and Plan:  LUQ pain  Constipation, unspecified constipation type NAFLD (nonalcoholic fatty liver disease) Obesity (BMI 35.0-39.9 without comorbidity)  -Need to consider constipation, also pancreatitis given his binge drinking history. -Check CMP, CBC, lipase today. -Have advised EtOH cessation, increase fluid and fiber consumption, have advised a daily stool softener/laxative such as Colace or MiraLAX.   Patient Instructions  -Nice seeing you today!!  -Lab work today; will notify you once results are available.  -Stop drinking alcohol. Increase fluid and fiber consumption. Take colace or miralax daily.     Lelon Frohlich, MD [facility]

## 2020-03-20 LAB — COMPREHENSIVE METABOLIC PANEL
AG Ratio: 1.6 (calc) (ref 1.0–2.5)
ALT: 27 U/L (ref 9–46)
AST: 22 U/L (ref 10–40)
Albumin: 4.4 g/dL (ref 3.6–5.1)
Alkaline phosphatase (APISO): 49 U/L (ref 36–130)
BUN: 12 mg/dL (ref 7–25)
CO2: 30 mmol/L (ref 20–32)
Calcium: 9.4 mg/dL (ref 8.6–10.3)
Chloride: 101 mmol/L (ref 98–110)
Creat: 0.99 mg/dL (ref 0.60–1.35)
Globulin: 2.7 g/dL (calc) (ref 1.9–3.7)
Glucose, Bld: 84 mg/dL (ref 65–99)
Potassium: 4.6 mmol/L (ref 3.5–5.3)
Sodium: 138 mmol/L (ref 135–146)
Total Bilirubin: 0.4 mg/dL (ref 0.2–1.2)
Total Protein: 7.1 g/dL (ref 6.1–8.1)

## 2020-03-20 LAB — CBC WITH DIFFERENTIAL/PLATELET
Absolute Monocytes: 669 cells/uL (ref 200–950)
Basophils Absolute: 53 cells/uL (ref 0–200)
Basophils Relative: 0.7 %
Eosinophils Absolute: 707 cells/uL — ABNORMAL HIGH (ref 15–500)
Eosinophils Relative: 9.3 %
HCT: 47.6 % (ref 38.5–50.0)
Hemoglobin: 16.4 g/dL (ref 13.2–17.1)
Lymphs Abs: 2972 cells/uL (ref 850–3900)
MCH: 30.7 pg (ref 27.0–33.0)
MCHC: 34.5 g/dL (ref 32.0–36.0)
MCV: 89 fL (ref 80.0–100.0)
MPV: 10.6 fL (ref 7.5–12.5)
Monocytes Relative: 8.8 %
Neutro Abs: 3200 cells/uL (ref 1500–7800)
Neutrophils Relative %: 42.1 %
Platelets: 253 10*3/uL (ref 140–400)
RBC: 5.35 10*6/uL (ref 4.20–5.80)
RDW: 12.2 % (ref 11.0–15.0)
Total Lymphocyte: 39.1 %
WBC: 7.6 10*3/uL (ref 3.8–10.8)

## 2020-03-20 LAB — LIPASE: Lipase: 24 U/L (ref 7–60)

## 2020-03-21 ENCOUNTER — Telehealth: Payer: Self-pay | Admitting: Internal Medicine

## 2020-03-21 ENCOUNTER — Ambulatory Visit: Payer: 59 | Admitting: Cardiology

## 2020-03-21 NOTE — Telephone Encounter (Signed)
Reviewed labs with patient.

## 2020-03-21 NOTE — Telephone Encounter (Signed)
Pt is calling in stating that he would like to have his lab results explained to him.  Pt would like to have a call back.

## 2020-03-27 ENCOUNTER — Ambulatory Visit (INDEPENDENT_AMBULATORY_CARE_PROVIDER_SITE_OTHER): Payer: 59 | Admitting: Cardiology

## 2020-03-27 ENCOUNTER — Encounter: Payer: Self-pay | Admitting: Cardiology

## 2020-03-27 ENCOUNTER — Other Ambulatory Visit: Payer: Self-pay

## 2020-03-27 VITALS — BP 130/70 | HR 68 | Ht 69.0 in | Wt 235.0 lb

## 2020-03-27 DIAGNOSIS — Z7189 Other specified counseling: Secondary | ICD-10-CM | POA: Diagnosis not present

## 2020-03-27 DIAGNOSIS — E6609 Other obesity due to excess calories: Secondary | ICD-10-CM | POA: Diagnosis not present

## 2020-03-27 DIAGNOSIS — R002 Palpitations: Secondary | ICD-10-CM

## 2020-03-27 DIAGNOSIS — Z6834 Body mass index (BMI) 34.0-34.9, adult: Secondary | ICD-10-CM | POA: Diagnosis not present

## 2020-03-27 NOTE — Patient Instructions (Signed)
Medication Instructions:  Your Physician recommend you continue on your current medication as directed.    *If you need a refill on your cardiac medications before your next appointment, please call your pharmacy*   Lab Work: None   Testing/Procedures: None   Follow-Up: At [facility], you and your health needs are our priority.  As part of our continuing mission to provide you with exceptional heart care, we have created designated Provider Care Teams.  These Care Teams include your primary Cardiologist (physician) and Advanced Practice Providers (APPs -  Physician Assistants and Nurse Practitioners) who all work together to provide you with the care you need, when you need it.  We recommend signing up for the patient portal called "MyChart".  Sign up information is provided on this After Visit Summary.  MyChart is used to connect with patients for Virtual Visits (Telemedicine).  Patients are able to view lab/test results, encounter notes, upcoming appointments, etc.  Non-urgent messages can be sent to your provider as well.   To learn more about what you can do with MyChart, go to [email or web address].    Your next appointment:   As needed  The format for your next appointment:   In Person  Provider:   Buford Dresser, MD

## 2020-03-27 NOTE — Progress Notes (Signed)
Cardiology Office Note:    Date:  03/27/2020   ID:  Jared Hunt, DOB [birth year], MRN [id number]  PCP:  Jared Hunt, Jared Halsted, MD  Cardiologist:  Jared Dresser, MD PhD  Referring MD: Jared Hunt, [name]   CC: follow up  History of Present Illness:    Jared Hunt is a 34 y.o. male with a hx of anxiety and depression who is seen today for follow up. I initially met him as a new consult at the request of Jared Hunt* for the evaluation and management of chest pain, shortness of breath, and palpitations on 03/17/18.  Today: Struggling with anxiety but overall feels like he is coping well. Runs 5 miles 3-4 times/week, does calisthenics at stopping points. Plans to get a gym membership to do more weight lifting. Wants to make his heart is ok to increase his intensity.  Continues to do well with his diet overall, rare splurges. Lots of vegetables, lean protein. Being careful with his alpha-gal. Uses a lot of CBD/edibles for anxiety.   Denies chest pain, shortness of breath at rest or with normal exertion. No PND, orthopnea, LE edema or unexpected weight gain. No syncope. Rare palpitations.  Past Medical History:  Diagnosis Date  . Allergy to alpha-gal   . Anxiety   . Depression age 16 - 67  . Fatty liver   . Sepsis ([place]) 03/2016   presumed sepsis following months of respiratory symptoms felt secondary to mold in living quarters    No past surgical history on file.  Current Medications: Current Outpatient Medications on File Prior to Visit  Medication Sig  . albuterol (VENTOLIN HFA) 108 (90 Base) MCG/ACT inhaler Inhale 1-2 puffs into the lungs every 6 (six) hours as needed for wheezing or shortness of breath.  . diclofenac sodium (VOLTAREN) 1 % GEL Apply 2 g topically 4 (four) times daily as needed.  Marland Kitchen EPINEPHrine 0.3 mg/0.3 mL IJ SOAJ injection INJECT CONTENTS OF 1 PEN INTO THE MUSCLE ONCE PRN  . fexofenadine (ALLEGRA) 180 MG tablet Take 180  mg by mouth daily as needed for allergies or rhinitis.  Marland Kitchen LYCOPENE PO Take by mouth.  . Pomegranate, Punica granatum, (POMEGRANATE PO) Take by mouth.  . RESVERATROL PO Take by mouth.  . Vitamin D, Ergocalciferol, (DRISDOL) 1.25 MG (50000 UNIT) CAPS capsule Take 1 capsule (50,000 Units total) by mouth every 7 (seven) days for 12 doses.   No current facility-administered medications on file prior to visit.     Allergies:   Sulfa antibiotics   Social History   Tobacco Use  . Smoking status: Former Smoker    Packs/day: 0.50    Years: 17.00    Pack years: 8.50    Types: Cigarettes  . Smokeless tobacco: Never Used  . Tobacco comment: CBD cigarettes  Vaping Use  . Vaping Use: Never used  Substance Use Topics  . Alcohol use: Yes    Comment: twice weekly  . Drug use: Not Currently    Types: Marijuana    Comment: MJ in teens only    Family History: The patient's family history includes Blindness in his sister; Hypothyroidism in his brother and sister; Osteoarthritis in his mother. There is no history of Asthma or Allergic Disorder.  FH: father's side is unknown. Mother is a smoker. Mat Gpa had stents, 4V CABG in his 27s, was a Ecologist in the past. Does have a history of high cholesterol (mother, maternal gma).  ROS:   Please see  the history of present illness.  Additional pertinent ROS otherwise unremarkable.  EKGs/Labs/Other Studies Reviewed:    The following studies were reviewed today: ECG, labs, prior ER notes  EKG:  EKG is ordered today.  The ekg ordered today demonstrates normal sinus rhythm with sinus arrhythmia, 68 bpm  Recent Labs: 03/19/2020: ALT 27; BUN 12; Creat 0.99; Hemoglobin 16.4; Platelets 253; Potassium 4.6; Sodium 138  Recent Lipid Panel    Component Value Date/Time   CHOL 194 01/05/2020 1057   CHOL 201 (H) 03/17/2018 0957   TRIG 65 01/05/2020 1057   HDL 51 01/05/2020 1057   HDL 41 03/17/2018 0957   CHOLHDL 3.8 01/05/2020 1057   VLDL 17.8 01/06/2019  1156   LDLCALC 127 (H) 01/05/2020 1057    Physical Exam:    VS:  BP 130/70   Pulse 68   Ht _0  (1.753 m)   Wt 235 lb (106.6 kg)   SpO2 99%   BMI 34.70 kg/m     Wt Readings from Last 3 Encounters:  03/27/20 235 lb (106.6 kg)  03/19/20 250 lb 9.6 oz (113.7 kg)  01/05/20 246 lb 4.8 oz (111.7 kg)    GEN: Well nourished, well developed in no acute distress HEENT: Normal, moist mucous membranes NECK: No JVD CARDIAC: regular rhythm, normal S1 and S2, no rubs or gallops. No murmur. VASCULAR: Radial and DP pulses 2+ bilaterally. No carotid bruits RESPIRATORY:  Clear to auscultation without rales, wheezing or rhonchi  ABDOMEN: Soft, non-tender, non-distended MUSCULOSKELETAL:  Ambulates independently SKIN: Warm and dry, no edema NEUROLOGIC:  Alert and oriented x 3. No focal neuro deficits noted. PSYCHIATRIC:  Normal affect   ASSESSMENT:    1. Palpitation   2. Class 1 obesity due to excess calories without serious comorbidity with body mass index (BMI) of 34.0 to 34.9 in adult   3. Cardiac risk counseling   4. Counseling on health promotion and disease prevention    PLAN:    Rare palpitations: we discussed this, options for monitoring/evaluation. They are rare and brief, no high risk features. No further testing at this time  Obesity, current BMI 34:  -weight was 250 lbs last year, today 235 lbs.  -making excellent progress, focused on health lifestyle.  CV risk counseling and primary prevention: -recommend heart healthy/Mediterranean diet, with whole grains, fruits, vegetable, fish, lean meats, nuts, and olive oil. Limit salt. He has made excellent changes, no animal products, since being diagnosed with alpha-gal deficiency -recommend moderate walking, 3-5 times/week for 30-50 minutes each session. Aim for at least 150 minutes.week. Goal should be pace of 3 miles/hours, or walking 1.5 miles in 30 minutes. He is now running, doing very well with lifestyle change -recommend  avoidance of tobacco products. Avoid excess alcohol. -Additional risk factor control:  -Diabetes: A1c now 5.5 from 6.1  -Lipids: done 12/2019, Tchol 194, HDL 51, LDL 127, TG 65  -Blood pressure control: not requiring treatment  Plan for follow up: as needed  Jared Dresser, MD, PhD [place]  [facility]   Medication Adjustments/Labs and Tests Ordered: Current medicines are reviewed at length with the patient today.  Concerns regarding medicines are outlined above.  Orders Placed This Encounter  Procedures  . EKG 12-Lead   No orders of the defined types were placed in this encounter.   Patient Instructions  Medication Instructions:  Your Physician recommend you continue on your current medication as directed.    *If you need a refill on your cardiac medications before  your next appointment, please call your pharmacy*   Lab Work: None   Testing/Procedures: None   Follow-Up: At [facility], you and your health needs are our priority.  As part of our continuing mission to provide you with exceptional heart care, we have created designated Provider Care Teams.  These Care Teams include your primary Cardiologist (physician) and Advanced Practice Providers (APPs -  Physician Assistants and Nurse Practitioners) who all work together to provide you with the care you need, when you need it.  We recommend signing up for the patient portal called "MyChart".  Sign up information is provided on this After Visit Summary.  MyChart is used to connect with patients for Virtual Visits (Telemedicine).  Patients are able to view lab/test results, encounter notes, upcoming appointments, etc.  Non-urgent messages can be sent to your provider as well.   To learn more about what you can do with MyChart, go to [email or web address].    Your next appointment:   As needed  The format for your next appointment:   In Person  Provider:   Buford Dresser, MD        Signed, Jared Dresser, MD PhD 03/27/2020     [place]

## 2020-04-12 ENCOUNTER — Other Ambulatory Visit: Payer: Self-pay

## 2020-05-14 ENCOUNTER — Encounter: Payer: Self-pay | Admitting: Internal Medicine

## 2020-08-30 IMAGING — CR DG CHEST 2V
2 series · 2 of 2 positions shown · non-contrast
Comparison: Prior chest radiograph 02/23/2018.

CLINICAL DATA: Shortness of breath. Additional history provided:
Patient reports shortness of breath for 3 weeks. Chest tightness,
dry cough.

EXAM:
CHEST - 2 VIEW

[w chest pa]
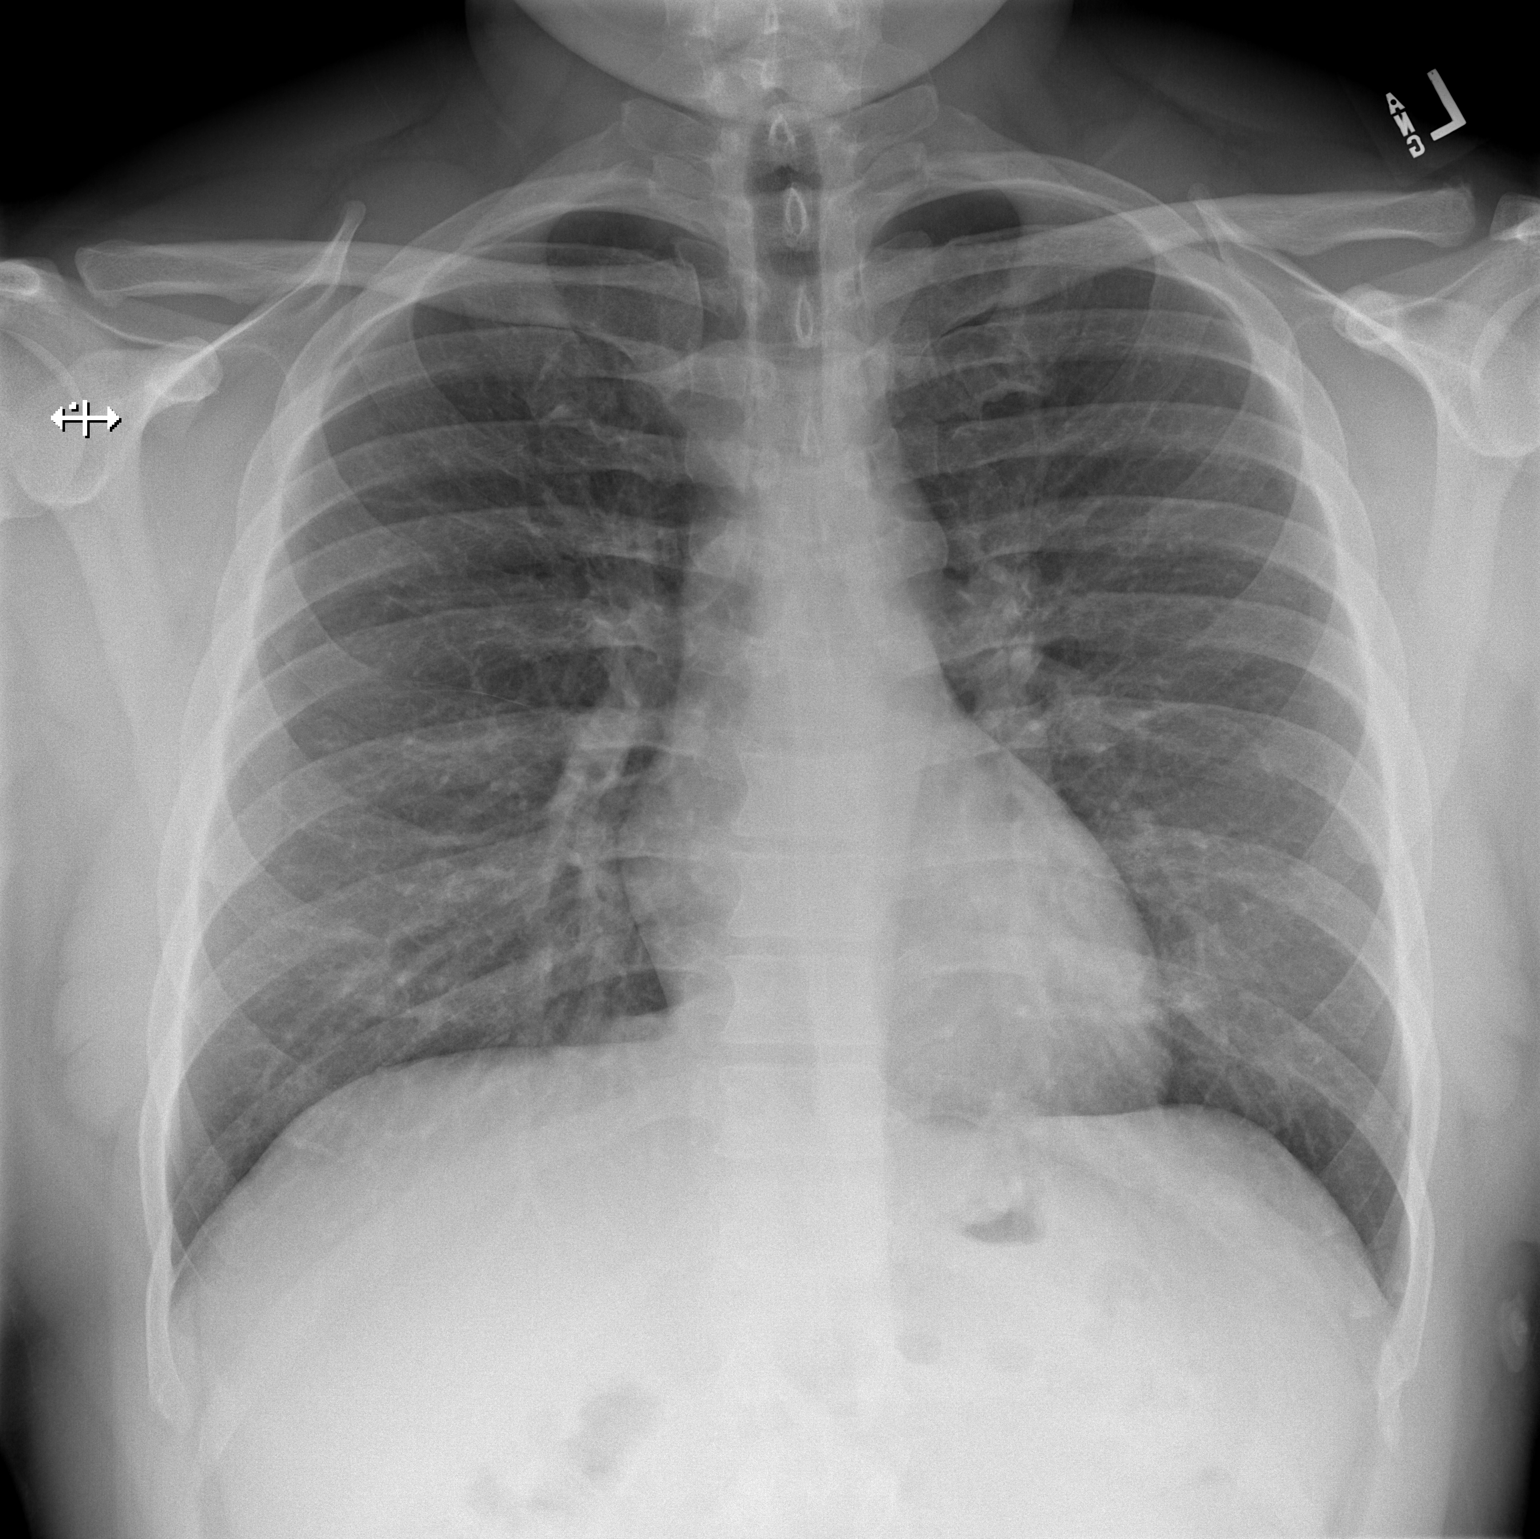

[w chest lat]
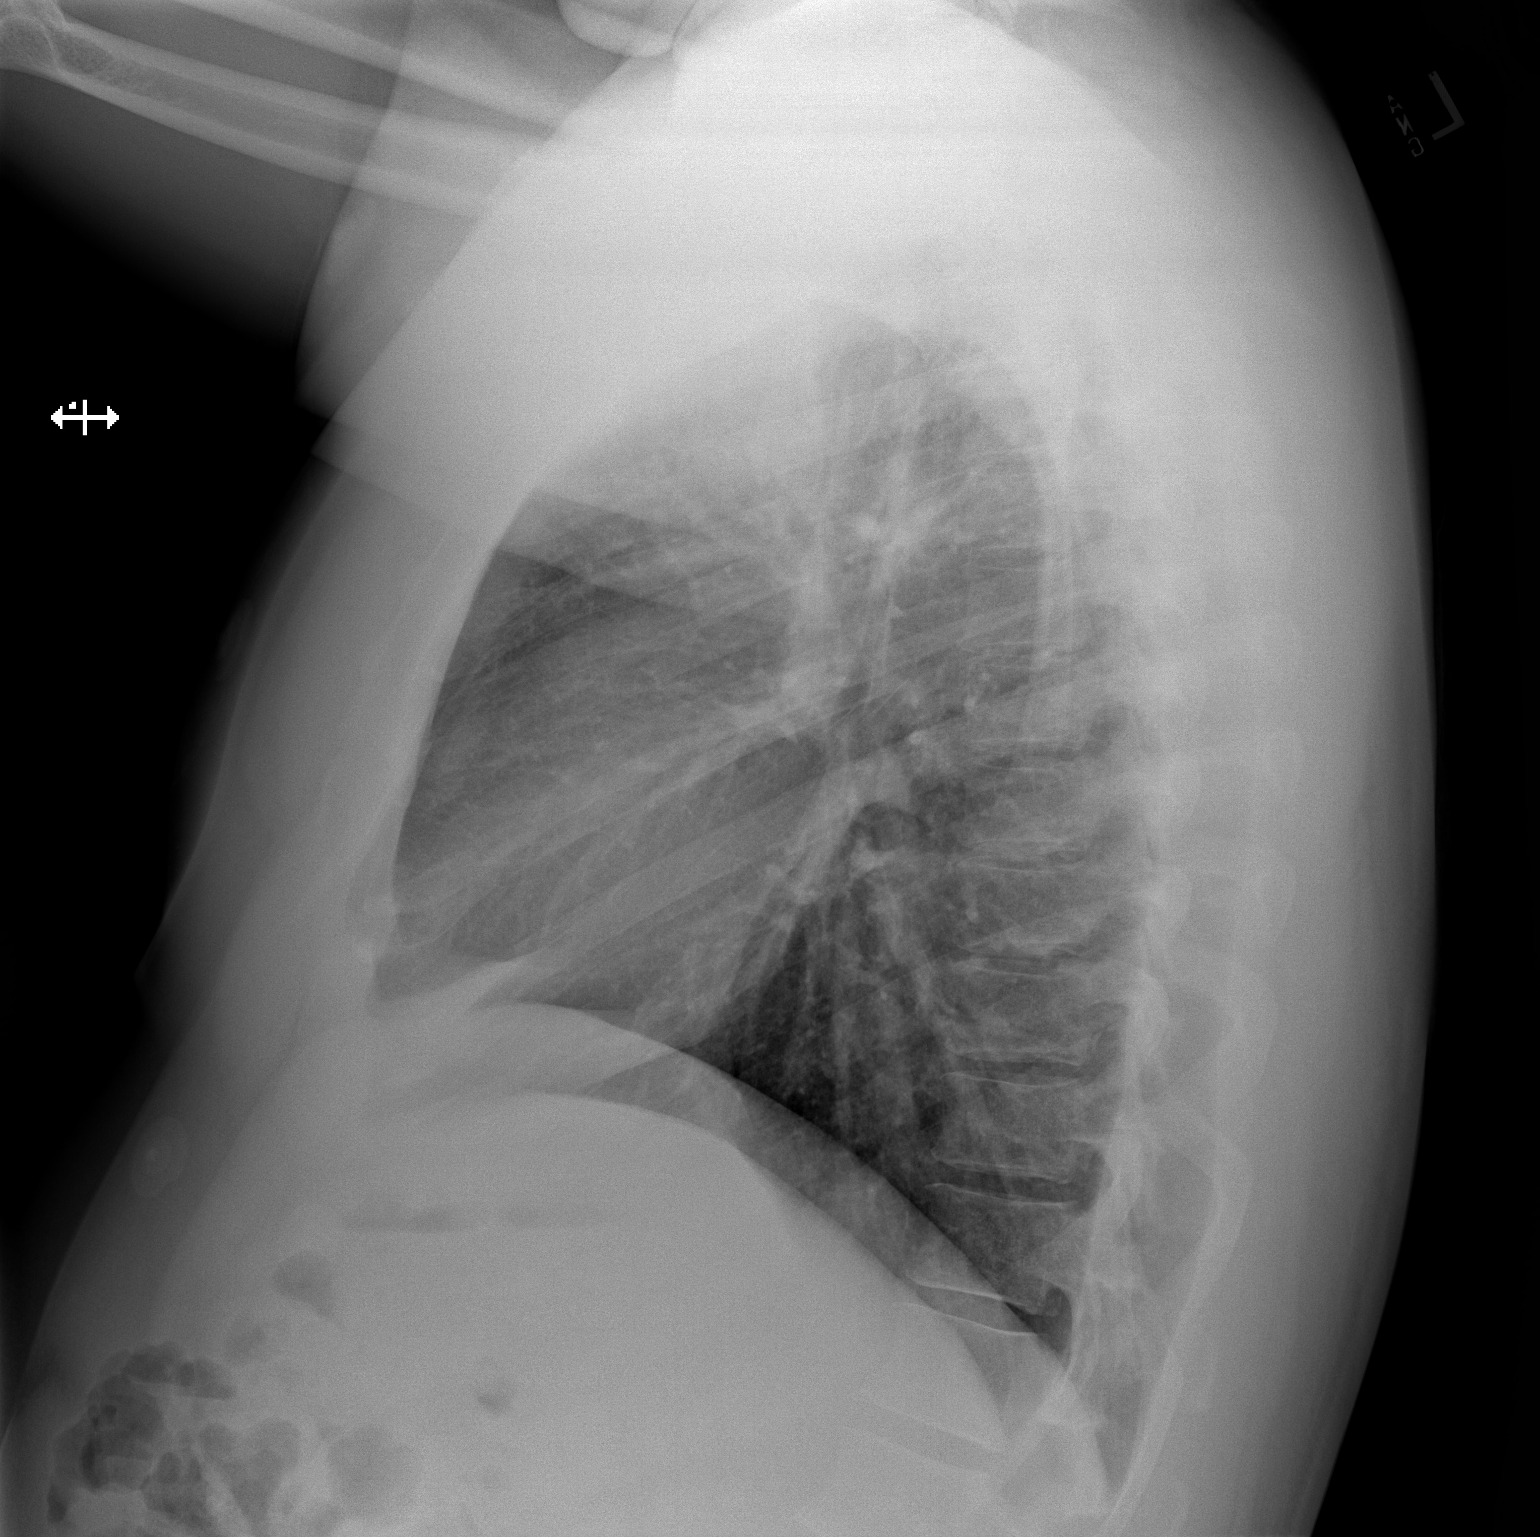

[2 of 2 positions shown; findings below may reference images not displayed]

FINDINGS: Heart size within normal limits. No appreciable airspace
consolidation. No evidence of pleural effusion or pneumothorax. No
acute bony abnormality identified.
IMPRESSION: No evidence of active cardiopulmonary disease.

## 2020-12-03 ENCOUNTER — Telehealth: Payer: 59 | Admitting: Family Medicine

## 2020-12-03 ENCOUNTER — Encounter: Payer: Self-pay | Admitting: Family Medicine

## 2020-12-03 DIAGNOSIS — R208 Other disturbances of skin sensation: Secondary | ICD-10-CM

## 2020-12-03 DIAGNOSIS — R059 Cough, unspecified: Secondary | ICD-10-CM

## 2020-12-03 DIAGNOSIS — U071 COVID-19: Secondary | ICD-10-CM

## 2020-12-03 MED ORDER — PROMETHAZINE-DM 6.25-15 MG/5ML PO SYRP: 2.5000 mL | ORAL_SOLUTION | Freq: Four times a day (QID) | ORAL | 0 refills | Status: AC | PRN

## 2020-12-03 MED ORDER — ALBUTEROL SULFATE HFA 108 (90 BASE) MCG/ACT IN AERS: 1.0000 | INHALATION_SPRAY | Freq: Four times a day (QID) | RESPIRATORY_TRACT | 0 refills | Status: AC | PRN

## 2020-12-03 NOTE — Patient Instructions (Addendum)
We hope that you feel better. Please follow up with your PCP as needed  Please report to the nearest ED if you develop chest pain, shortness of breath, or fever that is uncontrolled.  To keep from spreading the disease you should: Stay home and limit contact with other people as much as possible. Wash your hands frequently. Cover your coughs and sneezes with a tissue, and throw used tissues in the trash.   Clean and disinfect frequently touched surfaces and objects.     Take care of yourself by: Staying home Resting Drinking fluids Take fever-reducing medications (Tylenol/Acetaminophen and Ibuprofen)   For more information on the disease go to the Centers for Disease Control and Prevention website        Can take to lessen severity: Vit C 55m twice daily Quercertin 2694-503UUtwice daily Zinc 75-1038mdaily Melatonin 3-6 mg at bedtime Vit D3 1000-2000 IU daily Aspirin 81 mg daily with food Optional: Famotidine 2051maily Also can add tylenol/ibuprofen as needed for fevers and body aches May add Mucinex or Mucinex DM as needed for cough/congestion     10 Things You Can Do to Manage Your COVID-19 Symptoms at Home If you have possible or confirmed COVID-19: Stay home except to get medical care. Monitor your symptoms carefully. If your symptoms get worse, call your healthcare provider immediately. Get rest and stay hydrated. If you have a medical appointment, call the healthcare provider ahead of time and tell them that you have or may have COVID-19. For medical emergencies, call 911 and notify the dispatch personnel that you have or may have COVID-19. Cover your cough and sneezes with a tissue or use the inside of your elbow. Wash your hands often with soap and water for at least 20 seconds or clean your hands with an alcohol-based hand sanitizer that contains at least 60% alcohol. As much as possible, stay in a specific room and away from other people in your home. Also, you  should use a separate bathroom, if available. If you need to be around other people in or outside of the home, wear a mask. Avoid sharing personal items with other people in your household, like dishes, towels, and bedding. Clean all surfaces that are touched often, like counters, tabletops, and doorknobs. Use household cleaning sprays or wipes according to the label instructions. [email or web address] This information is not intended to replace advice given to you by your health care provider. Make sure you discuss any questions you have with your health care provider. Document Revised: 03/25/2020 Document Reviewed: 03/25/2020 Elsevier Patient Education  2021 [employer]

## 2020-12-03 NOTE — Progress Notes (Signed)
[name], swor are scheduled for a virtual visit with your provider today.    Just as we do with appointments in the office, we must obtain your consent to participate.  Your consent will be active for this visit and any virtual visit you may have with one of our providers in the next 365 days.    If you have a MyChart account, I can also send a copy of this consent to you electronically.  All virtual visits are billed to your insurance company just like a traditional visit in the office.  As this is a virtual visit, video technology does not allow for your provider to perform a traditional examination.  This may limit your provider's ability to fully assess your condition.  If your provider identifies any concerns that need to be evaluated in person or the need to arrange testing such as labs, EKG, etc, we will make arrangements to do so.    Although advances in technology are sophisticated, we cannot ensure that it will always work on either your end or our end.  If the connection with a video visit is poor, we may have to switch to a telephone visit.  With either a video or telephone visit, we are not always able to ensure that we have a secure connection.   I need to obtain your verbal consent now.   Are you willing to proceed with your visit today?   Frank Novelo Sollers has provided verbal consent on 12/03/2020 for a virtual visit (video or telephone).   Perlie Mayo, NP 12/03/2020  11:10 AM   Date:  12/03/2020   ID:  Jared Hunt, DOB 1985/07/18, MRN [id number]  Patient Location: Home Provider Location: Home Office   Participants: Patient and Provider for Visit and Wrap up  Method of visit: Video  Location of Patient: Home Location of Provider: Home Office Consent was obtain for visit over the video. Services rendered by provider: Visit was performed via video  A video enabled telemedicine application was used and I verified that I am speaking with the correct person using  two identifiers.  PCP:  Isaac Bliss, Rayford Halsted, MD   Chief Complaint:  +covid  History of Present Illness:    Jared Hunt is a 35 y.o. male with history as stated below. Presents video telehealth for an acute care visit + for covid. Mid day Saturday started feeling sensation of "intoxication" and headache. Worse symptom- but only happened once-yesterday- coughing blood up- small amount- more like a tinge- none since. Associated symptoms included Headache, fatigue, muscle and joint aches, skin/sunburn on back sensation, fever- highest 100.7  Denies having chills, shortness of breath, chest pain, ear pain, or known exposure to covid or other sick contacts. Modifying factors include: asa 325 mg two at a time several times a day over the last 24 hours. No other aggravating or relieving factors.  No other c/o.  The patient does have symptoms concerning for COVID-19 infection (fever, chills, cough, or new shortness of breath).   Past Medical, Surgical, Social History, Allergies, and Medications have been Reviewed.  Past Medical History:  Diagnosis Date   Allergy to alpha-gal    Anxiety    Depression age 38 - 24   Fatty liver    Sepsis ([place]) 03/2016   presumed sepsis following months of respiratory symptoms felt secondary to mold in living quarters    No outpatient medications have been marked as taking for the 12/03/20 encounter (Appointment) with Jerelene Redden,  Barbee Cough, NP.     Allergies:   Sulfa antibiotics   ROS See HPI for history of present illness.  Physical Exam HENT:     Head: Normocephalic.     Nose: Nose normal.  Eyes:     Conjunctiva/sclera: Conjunctivae normal.  Pulmonary:     Effort: Pulmonary effort is normal.     Comments: No cough or shortness of breath appreciated  Musculoskeletal:     Cervical back: Normal range of motion.  Skin:    Coloration: Skin is not jaundiced.  Neurological:     General: No focal deficit present.     Mental Status: He is  alert.              A&P  1. COVID-19 -s&s consistent with covid and + HT  -advised on OTC and isolation guidelines- avs info provided -encouraged hydration and reset -follow up with pcp and ED precautions provided -advised to stop asa and start tylenol as needed  Reviewed side effects, risks and benefits of medication.   Patient acknowledged agreement and understanding of the plan.   - promethazine-dextromethorphan (PROMETHAZINE-DM) 6.25-15 MG/5ML syrup; Take 2.5 mLs by mouth 4 (four) times daily as needed for cough.  Dispense: 118 mL; Refill: 0 - albuterol (VENTOLIN HFA) 108 (90 Base) MCG/ACT inhaler; Inhale 1-2 puffs into the lungs every 6 (six) hours as needed for wheezing or shortness of breath.  Dispense: 8 g; Refill: 0  2. Cough -promethazine DM provided -albuterol inhaler as well -  Reviewed side effects, risks and benefits of medication.   - Patient acknowledged agreement and understanding of the plan.   - promethazine-dextromethorphan (PROMETHAZINE-DM) 6.25-15 MG/5ML syrup; Take 2.5 mLs by mouth 4 (four) times daily as needed for cough.  Dispense: 118 mL; Refill: 0 - albuterol (VENTOLIN HFA) 108 (90 Base) MCG/ACT inhaler; Inhale 1-2 puffs into the lungs every 6 (six) hours as needed for wheezing or shortness of breath.  Dispense: 8 g; Refill: 0  3. Burning sensation of skin -sounds like hypersensitivity related to fevers or illness should subside -does not appear to be SJS   -advised to follow up if blister occurs or sensation does not pass   Time:   Today, I have spent 23 minutes with the patient with telehealth technology discussing the above problems, reviewing the chart, previous notes, medications and orders.   Medication Changes: No orders of the defined types were placed in this encounter.    Disposition:  Follow up as needed Signed, Perlie Mayo, NP  12/03/2020 11:10 AM

## 2020-12-12 ENCOUNTER — Encounter: Payer: Self-pay | Admitting: Primary Care

## 2020-12-12 ENCOUNTER — Ambulatory Visit (INDEPENDENT_AMBULATORY_CARE_PROVIDER_SITE_OTHER): Payer: 59 | Admitting: Primary Care

## 2020-12-12 ENCOUNTER — Other Ambulatory Visit: Payer: Self-pay

## 2020-12-12 ENCOUNTER — Ambulatory Visit (INDEPENDENT_AMBULATORY_CARE_PROVIDER_SITE_OTHER): Payer: 59

## 2020-12-12 VITALS — BP 122/82 | HR 95 | Temp 97.8°F | Ht 70.0 in | Wt 249.2 lb

## 2020-12-12 DIAGNOSIS — Z1152 Encounter for screening for COVID-19: Secondary | ICD-10-CM

## 2020-12-12 DIAGNOSIS — U071 COVID-19: Secondary | ICD-10-CM | POA: Diagnosis not present

## 2020-12-12 NOTE — Progress Notes (Signed)
@Patient  ID: Jared Hunt, male    DOB: Apr 10, 1986, 35 y.o.   MRN: [id number]  Chief Complaint  Patient presents with   Follow-up    Had covid 12/01/20, still having chest tightness and SOB, cough with clear/white mucous production. Feels like bronchitis    Referring provider: Isaac Hunt, [name]  HPI: 35 year old male, former smoker (8 and half pack-year history).  Past medical history significant for hyperlipidemia, obesity, vitamin D deficiency, bronchitis.  Patient of Jared Hunt, last seen for initial consult on 05/26/2018 for wheezing/pleurisy.  CT scan and chest x-ray showed structurally normal lungs.  Patient had normal spirometry.  12/12/2020- Interim  Patient presents today for acute visit. He tested positive for Covid July 10th. He was not hospitalized and did not receive either antivirals or monoclonal antibody treatment. He has a residual cough and shortness of breath. He is taking Mucinex as needed. He has not needed to use his Albuterol inhaler. He has prior history of mold exposure and is concern about possible Insterstitial lung disease from covid. He is wanting to get back to exercising and lifting. No pleuritic chest pain. He needs covid antigen test in September prior to going to [place].   Testing 02/21/18 CTA-no pulmonary embolism.  Lungs are clear, no edema or consolidation.  No pleural effusion or pleural thickening.  12/31/2019 chest x-ray-heart size within normal limits.  No airspace consolidation.  No evidence of active cardiopulmonary disease.   Allergies  Allergen Reactions   Sulfa Antibiotics Anaphylaxis    Immunization History  Administered Date(s) Administered   Tdap 03/18/2018    Past Medical History:  Diagnosis Date   Allergy to alpha-gal    Anxiety    Depression age 19 - 35   Fatty liver    Sepsis ([place]) 03/2016   presumed sepsis following months of respiratory symptoms felt secondary to mold in living quarters    Tobacco History: Social  History   Tobacco Use  Smoking Status Former   Packs/day: 0.50   Years: 17.00   Pack years: 8.50   Types: Cigarettes  Smokeless Tobacco Never  Tobacco Comments   CBD cigarettes   Counseling given: Yes Tobacco comments: CBD cigarettes   Outpatient Medications Prior to Visit  Medication Sig Dispense Refill   albuterol (VENTOLIN HFA) 108 (90 Base) MCG/ACT inhaler Inhale 1-2 puffs into the lungs every 6 (six) hours as needed for wheezing or shortness of breath. 8 g 0   diclofenac sodium (VOLTAREN) 1 % GEL Apply 2 g topically 4 (four) times daily as needed. 150 g 2   EPINEPHrine 0.3 mg/0.3 mL IJ SOAJ injection INJECT CONTENTS OF 1 PEN INTO THE MUSCLE ONCE PRN     fexofenadine (ALLEGRA) 180 MG tablet Take 180 mg by mouth daily as needed for allergies or rhinitis.     LYCOPENE PO Take by mouth.     Pomegranate, [place] gran[place], (POMEGRANATE PO) Take by mouth.     promethazine-dextromethorphan (PROMETHAZINE-DM) 6.25-15 MG/5ML syrup Take 2.5 mLs by mouth 4 (four) times daily as needed for cough. 118 mL 0   RESVERATROL PO Take by mouth.     No facility-administered medications prior to visit.      Review of Systems  Review of Systems  Constitutional: Negative.   HENT: Negative.    Respiratory:  Positive for cough and shortness of breath.   Cardiovascular: Negative.     Physical Exam  BP 122/82 (BP Location: Left Arm, Cuff Size: Normal)   Pulse 95  Temp 97.8 F (36.6 C) (Oral)   Ht 5' 10"  (1.778 m)   Wt 249 lb 3.2 oz (113 kg)   SpO2 98%   BMI 35.76 kg/m  Physical Exam Constitutional:      Appearance: Normal appearance.  HENT:     Head: Normocephalic and atraumatic.     Mouth/Throat:     Mouth: Mucous membranes are moist.     Pharynx: Oropharynx is clear.  Cardiovascular:     Rate and Rhythm: Normal rate and regular rhythm.  Pulmonary:     Effort: Pulmonary effort is normal.     Breath sounds: Normal breath sounds.  Skin:    General: Skin is warm and dry.   Neurological:     General: No focal deficit present.     Mental Status: He is alert and oriented to person, place, and time. Mental status is at baseline.  Psychiatric:        Mood and Affect: Mood normal.        Behavior: Behavior normal.        Thought Content: Thought content normal.        Judgment: Judgment normal.     Lab Results:  CBC    Component Value Date/Time   WBC 7.6 03/19/2020 1022   RBC 5.35 03/19/2020 1022   HGB 16.4 03/19/2020 1022   HCT 47.6 03/19/2020 1022   PLT 253 03/19/2020 1022   MCV 89.0 03/19/2020 1022   MCH 30.7 03/19/2020 1022   MCHC 34.5 03/19/2020 1022   RDW 12.2 03/19/2020 1022   LYMPHSABS 2,972 03/19/2020 1022   MONOABS 0.5 01/06/2019 1156   EOSABS 707 (H) 03/19/2020 1022   BASOSABS 53 03/19/2020 1022    BMET    Component Value Date/Time   NA 138 03/19/2020 1022   K 4.6 03/19/2020 1022   CL 101 03/19/2020 1022   CO2 30 03/19/2020 1022   GLUCOSE 84 03/19/2020 1022   BUN 12 03/19/2020 1022   CREATININE 0.99 03/19/2020 1022   CALCIUM 9.4 03/19/2020 1022   GFRNONAA >60 05/12/2018 2141   GFRAA >60 05/12/2018 2141    BNP    Component Value Date/Time   BNP 11.9 04/09/2017 0900    ProBNP No results found for: PROBNP  Imaging: No results found.   Assessment & Plan:   COVID-19 virus infection - Patient tested positive for covid-19 on July 10th. He was not hospitalized and did not require antivirals or monoclonal antibody treatment. He has a residual productive cough and shortness of breath. His exam was normal today, lungs were clear and O2 was 98% on room air.  He is concerned about possible interstitial lung disease from covid and his past exposure to mold. We will check a CXR today, if there is an abnormality will consider CT imaging. Reassured him that symptoms may take 6-8 weeks to improve/fully resolve. If he is not better would have him return and get PFTs.      Jared Ehrich, NP 12/12/2020

## 2020-12-12 NOTE — Assessment & Plan Note (Addendum)
-   Patient tested positive for covid-19 on July 10th. He was not hospitalized and did not require antivirals or monoclonal antibody treatment. He has a residual productive cough and shortness of breath. His exam was normal today, lungs were clear and O2 was 98% on room air.  He is concerned about possible interstitial lung disease from covid and his past exposure to mold. We will check a CXR today, if there is an abnormality will consider CT imaging. Reassured him that symptoms may take 6-8 weeks to improve/fully resolve. Continue mucinex 600-1235m BID as needed for cough and Albuterol hfa for shortness of breath. If he is not better would have him return and get PFTs.

## 2020-12-12 NOTE — Patient Instructions (Addendum)
Exam today was normal Lungs were clear and oxygen level was 98% RA  Recommendations: If may take 6-8 weeks for symptoms to fully resolve Check CXR today, if any abnormality is seen we will get a CT chest  Have covid antigen test before traveling to [place], results can be mailed to you or you can access on MyChart  Orders: CXR today Covid serology in Septmber  Follow-up: As needed only

## 2020-12-13 LAB — SARS-COV-2 ANTIBODY(IGG)SPIKE,SEMI-QUANTITATIVE: SARS COV1 AB(IGG)SPIKE,SEMI QN: <1 index (ref <1.00)

## 2021-11-10 ENCOUNTER — Ambulatory Visit: Payer: 59 | Admitting: Dermatology

## 2021-11-24 ENCOUNTER — Telehealth: Payer: Self-pay | Admitting: Physician Assistant

## 2021-11-24 DIAGNOSIS — R112 Nausea with vomiting, unspecified: Secondary | ICD-10-CM

## 2021-11-24 MED ORDER — ONDANSETRON HCL 4 MG PO TABS: 4.0000 mg | ORAL_TABLET | Freq: Three times a day (TID) | ORAL | 0 refills | Status: AC | PRN

## 2021-11-24 NOTE — Progress Notes (Signed)
E-Visit for Nausea and Vomiting   We are sorry that you are not feeling well. Here is how we plan to help!  Based on what you have shared with me it looks like you have a Virus that is irritating your GI tract.  Vomiting is the forceful emptying of a portion of the stomach's content through the mouth.  Although nausea and vomiting can make you feel miserable, it's important to remember that these are not diseases, but rather symptoms of an underlying illness.  When we treat short term symptoms, we always caution that any symptoms that persist should be fully evaluated in a medical office.  I have prescribed a medication that will help alleviate your symptoms and allow you to stay hydrated:  Zofran 4 mg 1 tablet every 8 hours as needed for nausea and vomiting  HOME CARE: Drink clear liquids.  This is very important! Dehydration (the lack of fluid) can lead to a serious complication.  Start off with 1 tablespoon every 5 minutes for 8 hours. You may begin eating bland foods after 8 hours without vomiting.  Start with saltine crackers, white bread, rice, mashed potatoes, applesauce. After 48 hours on a bland diet, you may resume a normal diet. Try to go to sleep.  Sleep often empties the stomach and relieves the need to vomit.  GET HELP RIGHT AWAY IF:  Your symptoms do not improve or worsen within 2 days after treatment. You have a fever for over 3 days. You cannot keep down fluids after trying the medication.  MAKE SURE YOU:  Understand these instructions. Will watch your condition. Will get help right away if you are not doing well or get worse.    Thank you for choosing an e-visit.  Your e-visit answers were reviewed by a board certified advanced clinical practitioner to complete your personal care plan. Depending upon the condition, your plan could have included both over the counter or prescription medications.  Please review your pharmacy choice. Make sure the pharmacy is open so  you can pick up prescription now. If there is a problem, you may contact your provider through [employer] and have the prescription routed to another pharmacy.  Your safety is important to [place]. If you have drug allergies check your prescription carefully.   For the next 24 hours you can use MyChart to ask questions about today's visit, request a non-urgent call back, or ask for a work or school excuse. You will get an email in the next two days asking about your experience. I hope that your e-visit has been valuable and will speed your recovery.  I provided 5 minutes of non face-to-face time during this encounter for chart review and documentation.
